# Patient Record
Sex: Male | Born: 1975
Health system: Southern US, Community
[De-identification: ages and names within clinical notes are randomized; demographics above are authoritative.]

## PROBLEM LIST (undated history)

## (undated) DIAGNOSIS — K219 Gastro-esophageal reflux disease without esophagitis: Secondary | ICD-10-CM

## (undated) DIAGNOSIS — E785 Hyperlipidemia, unspecified: Secondary | ICD-10-CM

## (undated) DIAGNOSIS — F419 Anxiety disorder, unspecified: Secondary | ICD-10-CM

## (undated) DIAGNOSIS — T7840XA Allergy, unspecified, initial encounter: Secondary | ICD-10-CM

## (undated) DIAGNOSIS — I10 Essential (primary) hypertension: Secondary | ICD-10-CM

## (undated) DIAGNOSIS — M1990 Unspecified osteoarthritis, unspecified site: Secondary | ICD-10-CM

## (undated) HISTORY — DX: Hyperlipidemia, unspecified: E78.5

## (undated) HISTORY — DX: Unspecified osteoarthritis, unspecified site: M19.90

## (undated) HISTORY — DX: Anxiety disorder, unspecified: F41.9

## (undated) HISTORY — PX: TONSILLECTOMY: SUR1361

## (undated) HISTORY — DX: Gastro-esophageal reflux disease without esophagitis: K21.9

## (undated) HISTORY — DX: Allergy, unspecified, initial encounter: T78.40XA

## (undated) HISTORY — PX: OTHER SURGICAL HISTORY: SHX169

---
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
## 2016-07-30 ENCOUNTER — Emergency Department (HOSPITAL_BASED_OUTPATIENT_CLINIC_OR_DEPARTMENT_OTHER): Payer: BLUE CROSS/BLUE SHIELD

## 2016-07-30 ENCOUNTER — Emergency Department (HOSPITAL_BASED_OUTPATIENT_CLINIC_OR_DEPARTMENT_OTHER)
Admission: EM | Admit: 2016-07-30 | Discharge: 2016-07-30 | Disposition: A | Discharge status: Home / Self Care | Payer: BLUE CROSS/BLUE SHIELD | Attending: Emergency Medicine | Admitting: Emergency Medicine

## 2016-07-30 ENCOUNTER — Encounter (HOSPITAL_BASED_OUTPATIENT_CLINIC_OR_DEPARTMENT_OTHER): Payer: Self-pay | Admitting: *Deleted

## 2016-07-30 DIAGNOSIS — Y998 Other external cause status: Secondary | ICD-10-CM | POA: Diagnosis not present

## 2016-07-30 DIAGNOSIS — Y929 Unspecified place or not applicable: Secondary | ICD-10-CM | POA: Insufficient documentation

## 2016-07-30 DIAGNOSIS — R0789 Other chest pain: Secondary | ICD-10-CM | POA: Insufficient documentation

## 2016-07-30 DIAGNOSIS — Y9372 Activity, wrestling: Secondary | ICD-10-CM | POA: Insufficient documentation

## 2016-07-30 DIAGNOSIS — Z791 Long term (current) use of non-steroidal anti-inflammatories (NSAID): Secondary | ICD-10-CM | POA: Diagnosis not present

## 2016-07-30 DIAGNOSIS — X58XXXA Exposure to other specified factors, initial encounter: Secondary | ICD-10-CM | POA: Diagnosis not present

## 2016-07-30 DIAGNOSIS — R0781 Pleurodynia: Secondary | ICD-10-CM | POA: Diagnosis not present

## 2016-07-30 DIAGNOSIS — S299XXA Unspecified injury of thorax, initial encounter: Secondary | ICD-10-CM | POA: Diagnosis not present

## 2016-07-30 HISTORY — PX: OTHER SURGICAL HISTORY: SHX169

## 2016-07-30 NOTE — ED Triage Notes (Signed)
Pt c/o right rib injury x 4 hrs ago

## 2016-07-30 NOTE — Discharge Instructions (Signed)
We suspect that you might have a broken rib not seen on x-ray. Take Tylenol or Advil for pain. Hold an ice pack on the painful area 4 times daily for 30 minutes at a time for the first 2 or 3 days. After 2 or 3 days, heat may feel better. You can stand under the warm shower and let warm water hit the area .Use the incentive spirometer as directed. See an urgent care center if having significant pain in 7-10 days. Return if concern for any reason

## 2016-07-30 NOTE — ED Provider Notes (Signed)
[facility] Provider Note   CSN: [id number] Arrival date & time: 07/30/16  1520  By signing my name below, I, Bernard Mora, attest that this documentation has been prepared under the direction and in the presence of Bernard Dakin, MD . Electronically Signed: Neta Mora, ED Scribe. 07/30/2016. 3:54 PM.    History   Chief Complaint Chief Complaint  Patient presents with  . Rib Injury    The history is provided by the patient. No language interpreter was used.   HPI Comments:  Bernard Mora is a 40 y.o. male who presents to the Emergency Department s/p right rib injury 4 years ago. Pt states that he was wrestling with his 40 year old on the floor when the injury occurred.His daughter may need him or kicked him in the ribs while wrestling. Pain is right mid rib cage, nonradiating. Moderate at present. Worse with changing position or pressing on the area. Denies abdominal pain Pt notes that he heard a "click" from his right ribs. Pt states that his current pain is alleviated when putting pressure on his ribs with his hands, and the pain is exacerbated when moving, taking deep breaths, and laying down. Pt complains of associated SOB. Pt does not smoke or use illicit dugs, but does drink EtOH occasionally. No other alleviating factors noted. Pt denies abdominal pain. No treatment prior to coming here  History reviewed. No pertinent past medical history.  There are no active problems to display for this patient.   Past Surgical History:  Procedure Laterality Date  . TONSILLECTOMY         Home Medications    Prior to Admission medications   Medication Sig Start Date End Date Taking? Authorizing Provider  ibuprofen (ADVIL,MOTRIN) 600 MG tablet Take 600 mg by mouth every 6 (six) hours as needed.   Yes Historical Provider, MD    Family History History reviewed. No pertinent family history.  Social History Social History  Substance Use Topics  . Smoking  status: Never Smoker  . Smokeless tobacco: Not on file  . Alcohol use No     Allergies   Patient has no known allergies.   Review of Systems Review of Systems  Constitutional: Negative.   HENT: Negative.   Respiratory: Negative.   Cardiovascular: Positive for chest pain.       Right chest pain  Gastrointestinal: Negative.   Musculoskeletal: Negative.   Skin: Negative.   Allergic/Immunologic: Negative.   Neurological: Negative.   Psychiatric/Behavioral: Negative.   All other systems reviewed and are negative.    Physical Exam Updated Vital Signs BP 162/86   Pulse 78   Temp 98.5 F (36.9 C) (Oral)   Resp 22   Ht 6' (1.829 m)   Wt 180 lb (81.6 kg)   SpO2 99%   BMI 24.41 kg/m   Physical Exam  Constitutional: He appears well-developed and well-nourished. No distress.  HENT:  Head: Normocephalic and atraumatic.  Eyes: Conjunctivae are normal.  Cardiovascular: Normal rate.   Pulmonary/Chest: Effort normal. He exhibits tenderness.  Chest without swelling or ecchymosis. Tender at right ribs immediately lateral to midclavicular line. Approximate 5 cm superior to hostile margin. No crepitance or flail  Abdominal: He exhibits no distension.  Neurological: He is alert.  Skin: Skin is warm and dry.  Psychiatric: He has a normal mood and affect.  Nursing note and vitals reviewed.    ED Treatments / Results  DIAGNOSTIC STUDIES:  Oxygen Saturation is 99% on RA,  normal by my interpretation.    COORDINATION OF CARE:  3:54 PM Discussed treatment plan with pt at bedside and pt agreed to plan.   Labs (all labs ordered are listed, but only abnormal results are displayed) Labs Reviewed - No data to display  EKG  EKG Interpretation None       Radiology No results found. X-ray viewed by me No results found for this or any previous visit. Dg Ribs Unilateral W/chest Right  Result Date: 07/30/2016 CLINICAL DATA:  Pain following wrestling injury EXAM: RIGHT RIBS  AND CHEST - 3+ VIEW COMPARISON:  None. FINDINGS: Frontal chest as well as oblique and cone-down lower rib images obtained. Lungs are clear. Heart size and pulmonary vascularity are normal. No adenopathy. No pneumothorax or pleural effusion. No evident rib fracture. IMPRESSION: No evident rib fracture.  Lungs clear. Electronically Signed   By: Lowella Grip III M.D.   On: 07/30/2016 16:15   Procedures Procedures (including critical care time)  Medications Ordered in ED Medications - No data to display   Initial Impression / Assessment and Plan / ED Course  I have reviewed the triage vital signs and the nursing notes.  Pertinent labs & imaging results that were available during my care of the patient were reviewed by me and considered in my medical decision making (see chart for details).  Clinical Course   Declines pain medicine Patient may have rib fracture clinically that is not apparent on x-ray. Plan Tylenol or Advil for pain. Incentive spirometry. Follow-up at urgent care center if having significant discomfort in one to 2 weeks.  Final Clinical Impressions(s) / ED Diagnoses   Chest wall pain Final diagnoses:  None    New Prescriptions New Prescriptions   No medications on file  I personally performed the services described in this documentation, which was scribed in my presence. The recorded information has been reviewed and considered.     Bernard Dakin, MD 07/30/16 [phone]

## 2016-10-18 ENCOUNTER — Encounter: Payer: Self-pay | Admitting: Sports Medicine

## 2016-10-18 ENCOUNTER — Encounter: Payer: Self-pay | Admitting: Physician Assistant

## 2016-10-18 ENCOUNTER — Ambulatory Visit (INDEPENDENT_AMBULATORY_CARE_PROVIDER_SITE_OTHER): Payer: BLUE CROSS/BLUE SHIELD | Admitting: Sports Medicine

## 2016-10-18 ENCOUNTER — Ambulatory Visit (INDEPENDENT_AMBULATORY_CARE_PROVIDER_SITE_OTHER): Payer: BLUE CROSS/BLUE SHIELD | Admitting: Physician Assistant

## 2016-10-18 ENCOUNTER — Ambulatory Visit: Payer: Self-pay

## 2016-10-18 VITALS — BP 138/90 | HR 70 | Ht 71.5 in | Wt 183.0 lb

## 2016-10-18 VITALS — BP 140/88 | HR 70 | Temp 99.0°F | Ht 71.5 in | Wt 183.0 lb

## 2016-10-18 DIAGNOSIS — S2329XS Dislocation of other parts of thorax, sequela: Secondary | ICD-10-CM | POA: Diagnosis not present

## 2016-10-18 DIAGNOSIS — R0781 Pleurodynia: Secondary | ICD-10-CM

## 2016-10-18 DIAGNOSIS — M9908 Segmental and somatic dysfunction of rib cage: Secondary | ICD-10-CM | POA: Diagnosis not present

## 2016-10-18 DIAGNOSIS — J0110 Acute frontal sinusitis, unspecified: Secondary | ICD-10-CM | POA: Diagnosis not present

## 2016-10-18 MED ORDER — BENZONATATE 200 MG PO CAPS: 200.0000 mg | ORAL_CAPSULE | Freq: Two times a day (BID) | ORAL | 0 refills | Status: DC | PRN

## 2016-10-18 MED ORDER — AMOXICILLIN-POT CLAVULANATE 875-125 MG PO TABS: 1.0000 | ORAL_TABLET | Freq: Two times a day (BID) | ORAL | 0 refills | Status: AC

## 2016-10-18 NOTE — Patient Instructions (Signed)
It was great meeting you today!  Follow-up with Bernard Mora for a physical at your earliest convenience.  Start the antibiotic for your sinus infection. I have also sent in Bernard Mora for your cough. Please let Bernard Mora know if you develop any worsening symptoms, especially if you develop fevers or shortness of breath.   Sinusitis, Adult Sinusitis is soreness and inflammation of your sinuses. Sinuses are hollow spaces in the bones around your face. Your sinuses are located:  Around your eyes.  In the middle of your forehead.  Behind your nose.  In your cheekbones. Your sinuses and nasal passages are lined with a stringy fluid (mucus). Mucus normally drains out of your sinuses. When your nasal tissues become inflamed or swollen, the mucus can become trapped or blocked so air cannot flow through your sinuses. This allows bacteria, viruses, and funguses to grow, which leads to infection. Sinusitis can develop quickly and last for 7?10 days (acute) or for more than 12 weeks (chronic). Sinusitis often develops after a cold. What are the causes? This condition is caused by anything that creates swelling in the sinuses or stops mucus from draining, including:  Allergies.  Asthma.  Bacterial or viral infection.  Abnormally shaped bones between the nasal passages.  Nasal growths that contain mucus (nasal polyps).  Narrow sinus openings.  Pollutants, such as chemicals or irritants in the air.  A foreign object stuck in the nose.  A fungal infection. This is rare. What increases the risk? The following factors may make you more likely to develop this condition:  Having allergies or asthma.  Having had a recent cold or respiratory tract infection.  Having structural deformities or blockages in your nose or sinuses.  Having a weak immune system.  Doing a lot of swimming or diving.  Overusing nasal sprays.  Smoking. What are the signs or symptoms? The main symptoms of this condition  are pain and a feeling of pressure around the affected sinuses. Other symptoms include:  Upper toothache.  Earache.  Headache.  Bad breath.  Decreased sense of smell and taste.  A cough that may get worse at night.  Fatigue.  Fever.  Thick drainage from your nose. The drainage is often green and it may contain pus (purulent).  Stuffy nose or congestion.  Postnasal drip. This is when extra mucus collects in the throat or back of the nose.  Swelling and warmth over the affected sinuses.  Sore throat.  Sensitivity to light. How is this diagnosed? This condition is diagnosed based on symptoms, a medical history, and a physical exam. To find out if your condition is acute or chronic, your health care provider may:  Look in your nose for signs of nasal polyps.  Tap over the affected sinus to check for signs of infection.  View the inside of your sinuses using an imaging device that has a light attached (endoscope). If your health care provider suspects that you have chronic sinusitis, you may also:  Be tested for allergies.  Have a sample of mucus taken from your nose (nasal culture) and checked for bacteria.  Have a mucus sample examined to see if your sinusitis is related to an allergy. If your sinusitis does not respond to treatment and it lasts longer than 8 weeks, you may have an MRI or CT scan to check your sinuses. These scans also help to determine how severe your infection is. In rare cases, a bone biopsy may be done to rule out more serious types  of fungal sinus disease. How is this treated? Treatment for sinusitis depends on the cause and whether your condition is chronic or acute. If a virus is causing your sinusitis, your symptoms will go away on their own within 10 days. You may be given medicines to relieve your symptoms, including:  Topical nasal decongestants. They shrink swollen nasal passages and let mucus drain from your sinuses.  Antihistamines. These  drugs block inflammation that is triggered by allergies. This can help to ease swelling in your nose and sinuses.  Topical nasal corticosteroids. These are nasal sprays that ease inflammation and swelling in your nose and sinuses.  Nasal saline washes. These rinses can help to get rid of thick mucus in your nose. If your condition is caused by bacteria, you will be given an antibiotic medicine. If your condition is caused by a fungus, you will be given an antifungal medicine. Surgery may be needed to correct underlying conditions, such as narrow nasal passages. Surgery may also be needed to remove polyps. Follow these instructions at home: Medicines   Take, use, or apply over-the-counter and prescription medicines only as told by your health care provider. These may include nasal sprays.  If you were prescribed an antibiotic medicine, take it as told by your health care provider. Do not stop taking the antibiotic even if you start to feel better. Hydrate and Humidify   Drink enough water to keep your urine clear or pale yellow. Staying hydrated will help to thin your mucus.  Use a cool mist humidifier to keep the humidity level in your home above 50%.  Inhale steam for 10-15 minutes, 3-4 times a day or as told by your health care provider. You can do this in the bathroom while a hot shower is running.  Limit your exposure to cool or dry air. Rest   Rest as much as possible.  Sleep with your head raised (elevated).  Make sure to get enough sleep each night. General instructions   Apply a warm, moist washcloth to your face 3-4 times a day or as told by your health care provider. This will help with discomfort.  Wash your hands often with soap and water to reduce your exposure to viruses and other germs. If soap and water are not available, use hand sanitizer.  Do not smoke. Avoid being around people who are smoking (secondhand smoke).  Keep all follow-up visits as told by your  health care provider. This is important. Contact a health care provider if:  You have a fever.  Your symptoms get worse.  Your symptoms do not improve within 10 days. Get help right away if:  You have a severe headache.  You have persistent vomiting.  You have pain or swelling around your face or eyes.  You have vision problems.  You develop confusion.  Your neck is stiff.  You have trouble breathing. This information is not intended to replace advice given to you by your health care provider. Make sure you discuss any questions you have with your health care provider. Document Released: 08/06/2005 Document Revised: 04/01/2016 Document Reviewed: 06/01/2015 Elsevier Interactive Patient Education  2017 [employer].

## 2016-10-18 NOTE — Progress Notes (Signed)
Bernard Mora - 41 y.o. male MRN [id number]  Date of birth: June 09, 1976  Office Visit Note: Visit Date: 10/18/2016 PCP: Bernard Coke, PA Referred by: Bernard Mora, [place]  Subjective: Chief Complaint  Patient presents with  . stiffness right side of rib cage    injured 12 weeks ago   HPI: Patient presents for evaluation of right anterior rib pain following an injury 12 weeks ago while sparring with his brother during jujitsu.  He reported acute onset of pain and crepitation that became progressively painful and was ultimately seen in the emergency department several hours after the incident occurred.  He difficulty breathing at this time.  X-rays did not reveal any significant bony abnormalities and his symptoms subsequently significantly improved.  Over the past 4 weeks however he has had consistent Bernard Mora is not using to speak.  He continues to have a prominence along the rest of the run out of his cage.  Ibuprofen has not significantly helpful.  This does not really worsen following selectivity since we goes the gym tries to run or perform the elliptical.  ROS:  Otherwise per HPI.  Objective:  VS:  HT:5' 11.5" (181.6 cm)   WT:183 lb (83 kg)  BMI:25.2    BP:138/90  HR:70bpm  TEMP: ( )  RESP:98 % Physical Exam: GENERAL:  WDWN, NAD, Non-toxic appearing PSYCH:  Alert & appropriately interactive  Not depressed or anxious appearing CHEST:  Pectus excavatum with increased prominence on the right compared to the left.  No flail chest. No significant overlying skin changes.  No bruising or ecchymosis. He has good thoracic excursion.  Full overhead range of motion of the shoulders.  Normal cervical range of motion. Structural exam: He has posterior ribs 6 through 9 on the right with costochondral prominence.  Imaging & Procedures: No results found. PROCEDURE NOTE : OSTEOPATHIC MANIPULATION The decision today to treat with Osteopathic Manipulative Therapy (OMT) was based on  physical exam findings. Verbal consent was obtained after after explanation of risks, benefits and potential side effects, including acute pain flare, post manipulation soreness and need for repeat treatments.  If Cervical manipulation was performed additional time was spent discussing the associated minimal risk of  injury to neurovascular structures.  After consent was obtained manipulation was performed as below:            Regions treated:  Per billing codes          Techniques used:  Muscle Energy, MFR, Indirect, CS  and ART The patient tolerated the treatment well and reported Improved symptoms following treatment today. Patient was given medications, exercises, stretches and lifestyle modifications per AVS and verbally.     Assessment & Plan: Problem List Items Addressed This Visit    Costochondral separation    Patient symptoms are consistent with costochondral separation without significant deformity.  He has underlying pectus excavatum which likely contributed given the low force.  Overall he is continuing to show good improvement.  Sample of Pennsaid was provided today for him to trial.  If this is helpful he will call for a prescription.  Otherwise slow return activities as tolerated.       Other Visit Diagnoses    Rib pain on right side    -  Primary   Somatic dysfunction of rib cage region          Follow-up: Return if symptoms worsen or fail to improve.   Past Medical/Family/Surgical/Social History: Medications & Allergies reviewed per EMR Patient Active Problem  List   Diagnosis Date Noted  . Costochondral separation 10/24/2016   Past Medical History:  Diagnosis Date  . Allergy   . Hyperlipidemia    Family History  Problem Relation Age of Onset  . Arthritis Mother   . Diverticulitis Mother   . Cancer Father   . Diabetes Paternal Grandfather    Past Surgical History:  Procedure Laterality Date  . crack ribs Right 07/30/2016  . FRACTURE SURGERY Left 1993    wrist  . TONSILLECTOMY     Social History   Occupational History  . Not on file.   Social History Main Topics  . Smoking status: Never Smoker  . Smokeless tobacco: Never Used  . Alcohol use 2.4 - 3.0 oz/week    4 - 5 Cans of beer per week  . Drug use: No  . Sexual activity: Yes    Partners: Female

## 2016-10-18 NOTE — Progress Notes (Signed)
Subjective:    Patient ID: Bernard Mora, male    DOB: 12-14-1975, 41 y.o.   MRN: [id number]  HPI  Bernard Mora is a 41 y/o male who presents to clinic today to establish care.  Acute Concerns: Upper respiratory infection: cough, nasal congestion with brown drainage, headache, sinus pressure x 2-3 weeks. He recently returned from trip from [place] two weeks ago. Started taking Mucinex with mild relief. No fevers, but having hot and cold spells. Yesterday was worst day. No hx of asthma or PNA. No known sick contacts.  Right rib cage pain: initial injury on 07/2016 from jiu jitsu with his brother, his brother's chest made contact with his R lower anterior rib cage and it made a "clicking sound", he went to the emergency department in 07/30/16 and x-ray showed no rib fracture with clear lungs. He declined pain medications and treated with rest, deep breathing and OTC tylenol/ibuprofen. After about 6 weeks he had some improvement and he returned to working out. He still has some stiffness and feels as though there is a "golf ball" sensation under his lower R anterior rib. He initially had bruising immediately after injury but this has resolved. He denies any chest pain or SOB.  Chronic Issues: Hyperlipidemia -- not looked at Jan 2016, was on a mild statin for 1-2 months, well controlled with diet, needs re-check at physical Hx of elevated blood pressure readings -- diet related, never on medications  Review of Systems  See HPI  Past Medical History:  Diagnosis Date  . Allergy   . Hyperlipidemia      Social History   Social History  . Marital status: Married    Spouse name: N/A  . Number of children: N/A  . Years of education: N/A   Occupational History  . Not on file.   Social History Main Topics  . Smoking status: Never Smoker  . Smokeless tobacco: Never Used  . Alcohol use 2.4 - 3.0 oz/week    4 - 5 Cans of beer per week  . Drug use: No  . Sexual activity: Yes    Partners: Female    Other Topics Concern  . Not on file   Social History Narrative   Live in [place]; originally from [place]   Works in Nurse, learning disability   Married, two children 2.5 and 8   Hobbies: spending time with kids, sports, martial arts, cars    Past Surgical History:  Procedure Laterality Date  . crack ribs Right 07/30/2016  . FRACTURE SURGERY Left 1993   wrist  . TONSILLECTOMY      Family History  Problem Relation Age of Onset  . Arthritis Mother   . Diverticulitis Mother   . Cancer Father   . Diabetes Paternal Grandfather     No Known Allergies  No current outpatient prescriptions on file prior to visit.   No current facility-administered medications on file prior to visit.     BP 140/88 (BP Location: Left Arm, Patient Position: Sitting, Cuff Size: Normal)   Pulse 70   Temp 99 F (37.2 C) (Oral)   Ht 5' 11.5" (1.816 m)   Wt 183 lb (83 kg)   SpO2 98%   BMI 25.17 kg/m      Objective:   Physical Exam  Constitutional: Vital signs are normal. He appears well-developed and well-nourished. He is cooperative.  Non-toxic appearance. He does not have a sickly appearance. He does not appear ill. No distress.  HENT:  Head: Normocephalic and atraumatic.  Right Ear: Tympanic membrane, external ear and ear canal normal. Tympanic membrane is not erythematous, not retracted and not bulging.  Left Ear: Tympanic membrane, external ear and ear canal normal. Tympanic membrane is not erythematous, not retracted and not bulging.  Nose: Right sinus exhibits frontal sinus tenderness. Right sinus exhibits no maxillary sinus tenderness. Left sinus exhibits frontal sinus tenderness. Left sinus exhibits no maxillary sinus tenderness.  Mouth/Throat: Uvula is midline. Posterior oropharyngeal erythema present. No oropharyngeal exudate or posterior oropharyngeal edema.  Cardiovascular: Normal rate, regular rhythm and normal heart sounds.   Pulmonary/Chest: Effort normal and breath sounds normal.  No accessory muscle usage. No respiratory distress.  Lungs clear to auscultation bilaterally  Musculoskeletal:  No bony tenderness to R anterior lower rib cage. Palpable bony deformity when compared to L anterior lower rib cage. No ecchymosis or erythema.  Lymphadenopathy:    He has no cervical adenopathy.  Neurological: He is alert.  Skin: Skin is warm, dry and intact.  Nursing note and vitals reviewed.     Assessment & Plan:  1. Rib pain on right side Discussed case briefly with Dr. Teresa Coombs. Patient would benefit from ultrasound to further evaluate and treat. Patient to see Dr. Teresa Coombs this morning and is in agreement to plan. Appreciate coordination of care.  2. Acute non-recurrent frontal sinusitis Given duration of symptoms and physical exam, will treat with Augmentin per orders. Tessalon for cough -- patient states that he is very sensitive to codeine-containing products. Advised follow-up with [place] if symptoms do not improve. Especially if worsening SOB or development of fevers.  Recommend follow-up for physical exam at earliest convenience.  Inda Coke PA-C 10/18/16

## 2016-10-18 NOTE — Progress Notes (Signed)
Pre visit review using our clinic review tool, if applicable. No additional management support is needed unless otherwise documented below in the visit note. 

## 2016-10-18 NOTE — Patient Instructions (Signed)
Please perform the exercise program that Jeneen Rinks has prepared for you and gone over in detail on a daily basis.  In addition to the handout you were provided you can access your program through: [email or web address]   Your unique program code is: [id number]

## 2016-10-24 DIAGNOSIS — S2329XA Dislocation of other parts of thorax, initial encounter: Secondary | ICD-10-CM | POA: Insufficient documentation

## 2016-10-24 NOTE — Assessment & Plan Note (Signed)
Patient symptoms are consistent with costochondral separation without significant deformity.  He has underlying pectus excavatum which likely contributed given the low force.  Overall he is continuing to show good improvement.  Sample of Pennsaid was provided today for him to trial.  If this is helpful he will call for a prescription.  Otherwise slow return activities as tolerated.

## 2017-05-23 ENCOUNTER — Encounter: Payer: Self-pay | Admitting: Physician Assistant

## 2017-05-23 ENCOUNTER — Ambulatory Visit (INDEPENDENT_AMBULATORY_CARE_PROVIDER_SITE_OTHER): Payer: BLUE CROSS/BLUE SHIELD | Admitting: Physician Assistant

## 2017-05-23 VITALS — BP 130/90 | HR 56 | Temp 98.2°F | Ht 71.5 in | Wt 180.0 lb

## 2017-05-23 DIAGNOSIS — Z125 Encounter for screening for malignant neoplasm of prostate: Secondary | ICD-10-CM

## 2017-05-23 DIAGNOSIS — Z0001 Encounter for general adult medical examination with abnormal findings: Secondary | ICD-10-CM

## 2017-05-23 DIAGNOSIS — F419 Anxiety disorder, unspecified: Secondary | ICD-10-CM | POA: Diagnosis not present

## 2017-05-23 DIAGNOSIS — R61 Generalized hyperhidrosis: Secondary | ICD-10-CM | POA: Diagnosis not present

## 2017-05-23 DIAGNOSIS — E785 Hyperlipidemia, unspecified: Secondary | ICD-10-CM

## 2017-05-23 DIAGNOSIS — Z114 Encounter for screening for human immunodeficiency virus [HIV]: Secondary | ICD-10-CM

## 2017-05-23 MED ORDER — SERTRALINE HCL 25 MG PO TABS: 25.0000 mg | ORAL_TABLET | Freq: Every day | ORAL | 1 refills | Status: DC

## 2017-05-23 NOTE — Patient Instructions (Addendum)
It was great to see you!  Please make an appointment with the lab on your way out. I would like for you to return for lab work within 1-2 weeks. After midnight on the day of the lab draw, please do not eat anything. You may have water, black coffee, unsweetened tea.  Follow-up in 6 weeks to discuss the zoloft.  Aluminum Chloride topical solution What is this medicine? Aluminum Chloride (a LOO mi num klor ide) is used to control excessive sweating. This medicine may be used for other purposes; ask your health care provider or pharmacist if you have questions. COMMON BRAND NAME(S): Drysol, Hypercare, Docia Chuck What should I tell my health care provider before I take this medicine? They need to know if you have any of these conditions: -an unusual or allergic reaction to aluminum chloride, other medicines, foods, dyes, or preservatives -pregnant or trying to get pregnant -breast-feeding How should I use this medicine? This medicine is for external use only. Follow the directions on the prescription label. Make sure the skin is dry before use. Apply to the affected area as directed by your doctor or health care professional, usually at bedtime. Avoid contact with broken, irritated or recently shaved skin. Do not use your medicine more often than directed. Talk to your pediatrician regarding the use of this medicine in children. Special care may be needed. Overdosage: If you think you have taken too much of this medicine contact a poison control center or emergency room at once. NOTE: This medicine is only for you. Do not share this medicine with others. What if I miss a dose? If you miss a dose, use it as soon as you can. If it is almost time for your next dose, use only that dose. Do not use double or extra doses. What may interact with this medicine? Interactions are not expected. Do not use any other skin products on the affected area without asking your doctor or health care  professional. This list may not describe all possible interactions. Give your health care provider a list of all the medicines, herbs, non-prescription drugs, or dietary supplements you use. Also tell them if you smoke, drink alcohol, or use illegal drugs. Some items may interact with your medicine. What should I watch for while using this medicine? You may notice a decrease in sweating after two treatments. Call your doctor or health care professional if your condition does not start to get better or if it gets worse. To help increase the effect of this medicine, your doctor or health care professional may tell you to cover the treated area with saran wrap held in place by a snug fitting t-shirt, mitten or sock. Do not use tape to hold the saran wrap in place. This medicine may discolor fabrics and may be harmful to certain metals. Avoid contact with clothing and jewelry. Do not use this medicine near open flame. What side effects may I notice from receiving this medicine? Side effects that you should report to your doctor or health care professional as soon as possible: -allergic reactions like skin rash, itching or hives, swelling of the face, lips, or tongue -excessive irritation or sensitivity Side effects that usually do not require medical attention (report to your doctor or health care professional if they continue or are bothersome): -mild irritation This list may not describe all possible side effects. Call your doctor for medical advice about side effects. You may report side effects to FDA at 1-800-FDA-1088. Where should I  keep my medicine? Keep out of the reach of children. Store at room temperature between 15 and 30 degrees C (59 and 86 degrees C). Throw away any unused medicine after the expiration date. NOTE: This sheet is a summary. It may not cover all possible information. If you have questions about this medicine, talk to your doctor, pharmacist, or health care provider.  2018  Elsevier/Gold Standard (2013-06-10 17:30:08)    Health Maintenance, Male A healthy lifestyle and preventive care is important for your health and wellness. Ask your health care provider about what schedule of regular examinations is right for you. What should I know about weight and diet? Eat a Healthy Diet  Eat plenty of vegetables, fruits, whole grains, low-fat dairy products, and lean protein.  Do not eat a lot of foods high in solid fats, added sugars, or salt.  Maintain a Healthy Weight Regular exercise can help you achieve or maintain a healthy weight. You should:  Do at least 150 minutes of exercise each week. The exercise should increase your heart rate and make you sweat (moderate-intensity exercise).  Do strength-training exercises at least twice a week.  Watch Your Levels of Cholesterol and Blood Lipids  Have your blood tested for lipids and cholesterol every 5 years starting at 41 years of age. If you are at high risk for heart disease, you should start having your blood tested when you are 41 years old. You may need to have your cholesterol levels checked more often if: ? Your lipid or cholesterol levels are high. ? You are older than 41 years of age. ? You are at high risk for heart disease.  What should I know about cancer screening? Many types of cancers can be detected early and may often be prevented. Lung Cancer  You should be screened every year for lung cancer if: ? You are a current smoker who has smoked for at least 30 years. ? You are a former smoker who has quit within the past 15 years.  Talk to your health care provider about your screening options, when you should start screening, and how often you should be screened.  Colorectal Cancer  Routine colorectal cancer screening usually begins at 41 years of age and should be repeated every 5-10 years until you are 41 years old. You may need to be screened more often if early forms of precancerous polyps  or small growths are found. Your health care provider may recommend screening at an earlier age if you have risk factors for colon cancer.  Your health care provider may recommend using home test kits to check for hidden blood in the stool.  A small camera at the end of a tube can be used to examine your colon (sigmoidoscopy or colonoscopy). This checks for the earliest forms of colorectal cancer.  Prostate and Testicular Cancer  Depending on your age and overall health, your health care provider may do certain tests to screen for prostate and testicular cancer.  Talk to your health care provider about any symptoms or concerns you have about testicular or prostate cancer.  Skin Cancer  Check your skin from head to toe regularly.  Tell your health care provider about any new moles or changes in moles, especially if: ? There is a change in a mole's size, shape, or color. ? You have a mole that is larger than a pencil eraser.  Always use sunscreen. Apply sunscreen liberally and repeat throughout the day.  Protect yourself by wearing long  sleeves, pants, a wide-brimmed hat, and sunglasses when outside.  What should I know about heart disease, diabetes, and high blood pressure?  If you are 45-44 years of age, have your blood pressure checked every 3-5 years. If you are 75 years of age or older, have your blood pressure checked every year. You should have your blood pressure measured twice-once when you are at a hospital or clinic, and once when you are not at a hospital or clinic. Record the average of the two measurements. To check your blood pressure when you are not at a hospital or clinic, you can use: ? An automated blood pressure machine at a pharmacy. ? A home blood pressure monitor.  Talk to your health care provider about your target blood pressure.  If you are between 51-3 years old, ask your health care provider if you should take aspirin to prevent heart disease.  Have  regular diabetes screenings by checking your fasting blood sugar level. ? If you are at a normal weight and have a low risk for diabetes, have this test once every three years after the age of 18. ? If you are overweight and have a high risk for diabetes, consider being tested at a younger age or more often.  A one-time screening for abdominal aortic aneurysm (AAA) by ultrasound is recommended for men aged 36-75 years who are current or former smokers. What should I know about preventing infection? Hepatitis B If you have a higher risk for hepatitis B, you should be screened for this virus. Talk with your health care provider to find out if you are at risk for hepatitis B infection. Hepatitis C Blood testing is recommended for:  Everyone born from [birth year] through 1965.  Anyone with known risk factors for hepatitis C.  Sexually Transmitted Diseases (STDs)  You should be screened each year for STDs including gonorrhea and chlamydia if: ? You are sexually active and are younger than 41 years of age. ? You are older than 41 years of age and your health care provider tells you that you are at risk for this type of infection. ? Your sexual activity has changed since you were last screened and you are at an increased risk for chlamydia or gonorrhea. Ask your health care provider if you are at risk.  Talk with your health care provider about whether you are at high risk of being infected with HIV. Your health care provider may recommend a prescription medicine to help prevent HIV infection.  What else can I do?  Schedule regular health, dental, and eye exams.  Stay current with your vaccines (immunizations).  Do not use any tobacco products, such as cigarettes, chewing tobacco, and e-cigarettes. If you need help quitting, ask your health care provider.  Limit alcohol intake to no more than 2 drinks per day. One drink equals 12 ounces of beer, 5 ounces of wine, or 1 ounces of hard liquor.  Do  not use street drugs.  Do not share needles.  Ask your health care provider for help if you need support or information about quitting drugs.  Tell your health care provider if you often feel depressed.  Tell your health care provider if you have ever been abused or do not feel safe at home. This information is not intended to replace advice given to you by your health care provider. Make sure you discuss any questions you have with your health care provider. Document Released: 02/02/2008 Document Revised: 04/04/2016 Document Reviewed: 05/10/2015  Chartered certified accountant Patient Education  [employer].

## 2017-05-23 NOTE — Progress Notes (Signed)
SCRIBE STATEMENT  Subjective:    Bernard Mora is a 41 y.o. male and is here for a comprehensive physical exam.  HPI  Health Maintenance Due  Topic Date Due  . HIV Screening  03/27/1991    Acute Concerns: Sweating -- over the past 6 months he has noticed increase in sweating -- which also coincides with increase in recent anxiety, mostly in underarms and back of neck, has to change shirt a few times a day because it's so severe; does drink at least 3 cups of coffee daily, denies illicit drugs, changes in weight, denies chest pain, unintentional weight loss, bloody stools Anxiety -- family therapist, sees monthly, oldest child with health issues, changed his job 2 years ago so he could try to balance home life a bit better, considers himself "high-strung"; didn't have a great childhood personally and struggles with his parenting, agreeable to medication  Chronic Issues: Hyperlipidemia -- was put on medications for a brief amount of time for about 2 months a few years ago but they he cleaned his diet up, will re-check today  Health Maintenance: Immunizations -- declined flu shot Colonoscopy -- start at age 44  PSA -- would like screening today -- we had extensive conversation regarding risks/benefits Diet -- eats all food groups but shellfish, avoids cows milk (does do almond milk) Bfast: oatmeal with fruit or scrambled eggs/bacon occasionally, smoothies  Lunch/dinner: lot of vegetables, seafood, lean casseroles, sweet potatoes, bowl of nuts Out: chickfila or panera bread Drink: 2-3 cups coffee in the AM, drinks a lot of water, 2-3 beers a couple times a week, occasional orange juice Caffeine intake -- 2-3 cups coffee in the AM, occasional in the PM Sleep habits -- off and on, wakes up in the middle of the night; very light sleeper, no snoring Exercise -- walk 2-3 miles q other day, kickboxing 3 x week (1.5 hours each time), occasional running Weight -- Weight: 180 lb (81.6 kg)  -- 175 -  182 lb is normal for him, 32 in the waist Mood -- average, lots of stress with family health; job is not stressful   Depression screen PHQ 2/9 05/23/2017  Decreased Interest 0  Down, Depressed, Hopeless 1  PHQ - 2 Score 1   Other providers/specialists: Teresa Coombs -- sports medicine, sees prn   PMHx, SurgHx, SocialHx, Medications, and Allergies were reviewed in the Visit Navigator and updated as appropriate.   Past Medical History:  Diagnosis Date  . Allergy   . Hyperlipidemia      Past Surgical History:  Procedure Laterality Date  . crack ribs Right 07/30/2016  . FRACTURE SURGERY Left 1993   wrist  . TONSILLECTOMY       Family History  Problem Relation Age of Onset  . Arthritis Mother   . Diverticulitis Mother   . Cancer Father        don't know the type, possible kidney/lung and mets  . Diabetes Paternal Grandfather     Social History  Substance Use Topics  . Smoking status: Never Smoker  . Smokeless tobacco: Never Used  . Alcohol use 2.4 - 3.0 oz/week    4 - 5 Cans of beer per week    Review of Systems:   Review of Systems  Constitutional: Positive for diaphoresis. Negative for chills, fever, malaise/fatigue and weight loss.  HENT: Negative for hearing loss, sinus pain and sore throat.   Eyes: Negative for blurred vision.  Respiratory: Negative for cough and shortness of breath.  Cardiovascular: Negative for chest pain, palpitations and leg swelling.  Gastrointestinal: Negative for abdominal pain, constipation, diarrhea, heartburn, nausea and vomiting.  Genitourinary: Negative for dysuria, frequency and urgency.  Musculoskeletal: Negative for back pain, myalgias and neck pain.  Skin: Negative for itching and rash.  Neurological: Negative for dizziness, tingling, seizures, loss of consciousness and headaches.  Endo/Heme/Allergies: Negative for polydipsia.  Psychiatric/Behavioral: Negative for depression. The patient is nervous/anxious.      Objective:   Vitals:   05/23/17 0937 05/23/17 1024  BP: 138/90 130/90  Pulse: (!) 56   Temp: 98.2 F (36.8 C)   SpO2: 99%    Body mass index is 24.76 kg/m.  General Appearance:  Alert, cooperative, no distress, appears stated age  Head:  Normocephalic, without obvious abnormality, atraumatic  Eyes:  PERRL, conjunctiva/corneas clear, EOM's intact, fundi benign, both eyes       Ears:  Normal TM's and external ear canals, both ears  Nose: Nares normal, septum midline, mucosa normal, no drainage    or sinus tenderness  Throat: Lips, mucosa, and tongue normal; teeth and gums normal  Neck: Supple, symmetrical, trachea midline, no adenopathy; thyroid:  No enlargement/tenderness/nodules; no carotit bruit or JVD  Back:   Symmetric, no curvature, ROM normal, no CVA tenderness  Lungs:   Clear to auscultation bilaterally, respirations unlabored  Chest wall:  No tenderness or deformity  Heart:  Regular rate and rhythm, S1 and S2 normal, no murmur, rub   or gallop  Abdomen:   Soft, non-tender, bowel sounds active all four quadrants, no masses, no organomegaly  Extremities: Extremities normal, atraumatic, no cyanosis or edema  Prostate: Not done.   Skin: Skin color, texture, turgor normal, no rashes or lesions  Lymph nodes: Cervical, supraclavicular, and axillary nodes normal  Neurologic: CNII-XII grossly intact. Normal strength, sensation and reflexes throughout    Assessment/Plan:   Bernard Mora was seen today for annual exam.  Diagnoses and all orders for this visit:  Encounter for general adult medical examination with abnormal findings Today patient counseled on age appropriate routine health concerns for screening and prevention, each reviewed and up to date or declined. Immunizations reviewed and up to date or declined. Labs ordered and reviewed. Risk factors for depression reviewed and negative. Hearing function and visual acuity are intact. ADLs screened and addressed as needed.  Functional ability and level of safety reviewed and appropriate. Education, counseling and referrals performed based on assessed risks today. Patient provided with a copy of personalized plan for preventive services. -     Comprehensive metabolic panel; Future -     CBC with Differential/Platelet; Future  Sweating increase Suspect anxiety related, however we would like to check routine labs as well as TSH to assess for organic cause. We briefly discussed Drysol, however he would like to consider this after labs return. Work on hydration. Avoid excessive caffeine intake. -     Comprehensive metabolic panel; Future -     CBC with Differential/Platelet; Future -     TSH; Future  Anxiety Will obtain labs today. Declined therapy, but he is agreeable to starting low-dose SSRI to help with symptoms. Will trial 25 mg Zoloft and have him follow-up in 4-6 weeks to assess efficacy, sooner if needed. -     Comprehensive metabolic panel; Future -     CBC with Differential/Platelet; Future -     TSH; Future -     T4, free; Future  Prostate cancer screening Discussed risks/benefits of checking PSA today. He would  like this level checked. Declined DRE. -     PSA; Future  Hyperlipidemia, unspecified hyperlipidemia type -     Lipid panel; Future  Encounter for screening for HIV -     HIV antibody; Future  Other orders -     sertraline (ZOLOFT) 25 MG tablet; Take 1 tablet (25 mg total) by mouth daily.    Well Adult Exam: Labs ordered: Yes. Patient counseling was done. See below for items discussed. Discussed the patient's BMI.  The BMI BMI is in the acceptable range Follow up in 6 weeks.  Patient Counseling: [x]   Nutrition: Stressed importance of moderation in sodium/caffeine intake, saturated fat and cholesterol, caloric balance, sufficient intake of fresh fruits, vegetables, and fiber.  [x]   Stressed the importance of regular exercise.   []   Substance Abuse: Discussed cessation/primary prevention  of tobacco, alcohol, or other drug use; driving or other dangerous activities under the influence; availability of treatment for abuse.   [x]   Injury prevention: Discussed safety belts, safety helmets, smoke detector, smoking near bedding or upholstery.   []   Sexuality: Discussed sexually transmitted diseases, partner selection, use of condoms, avoidance of unintended pregnancy  and contraceptive alternatives.   [x]   Dental health: Discussed importance of regular tooth brushing, flossing, and dental visits.  [x]   Health maintenance and immunizations reviewed. Please refer to Health maintenance section.     CMA or LPN served as scribe during this visit. History, Physical, and Plan performed by medical provider. Documentation and orders reviewed and attested to.  Inda Coke, PA-C [place]

## 2017-05-24 ENCOUNTER — Other Ambulatory Visit: Payer: BLUE CROSS/BLUE SHIELD

## 2017-05-27 ENCOUNTER — Other Ambulatory Visit (INDEPENDENT_AMBULATORY_CARE_PROVIDER_SITE_OTHER): Payer: BLUE CROSS/BLUE SHIELD

## 2017-05-27 DIAGNOSIS — Z114 Encounter for screening for human immunodeficiency virus [HIV]: Secondary | ICD-10-CM | POA: Diagnosis not present

## 2017-05-27 DIAGNOSIS — Z0001 Encounter for general adult medical examination with abnormal findings: Secondary | ICD-10-CM | POA: Diagnosis not present

## 2017-05-27 DIAGNOSIS — R61 Generalized hyperhidrosis: Secondary | ICD-10-CM | POA: Diagnosis not present

## 2017-05-27 DIAGNOSIS — F419 Anxiety disorder, unspecified: Secondary | ICD-10-CM | POA: Diagnosis not present

## 2017-05-27 DIAGNOSIS — E785 Hyperlipidemia, unspecified: Secondary | ICD-10-CM

## 2017-05-27 DIAGNOSIS — Z125 Encounter for screening for malignant neoplasm of prostate: Secondary | ICD-10-CM

## 2017-05-27 LAB — COMPREHENSIVE METABOLIC PANEL
ALBUMIN: 4.4 g/dL (ref 3.5–5.2)
ALK PHOS: 43 U/L (ref 39–117)
ALT: 13 U/L (ref 0–53)
AST: 15 U/L (ref 0–37)
BILIRUBIN TOTAL: 0.6 mg/dL (ref 0.2–1.2)
BUN: 11 mg/dL (ref 6–23)
CO2: 30 mEq/L (ref 19–32)
Calcium: 9.3 mg/dL (ref 8.4–10.5)
Chloride: 99 mEq/L (ref 96–112)
Creatinine, Ser: 1.05 mg/dL (ref 0.40–1.50)
GFR: 82.66 mL/min (ref >60.00)
Glucose, Bld: 97 mg/dL (ref 70–99)
POTASSIUM: 3.9 meq/L (ref 3.5–5.1)
SODIUM: 138 meq/L (ref 135–145)
TOTAL PROTEIN: 6.8 g/dL (ref 6.0–8.3)

## 2017-05-27 LAB — CBC WITH DIFFERENTIAL/PLATELET
BASOS ABS: 0 10*3/uL (ref 0.0–0.1)
Basophils Relative: 0.6 % (ref 0.0–3.0)
EOS PCT: 1.4 % (ref 0.0–5.0)
Eosinophils Absolute: 0.1 10*3/uL (ref 0.0–0.7)
HCT: 44.2 % (ref 39.0–52.0)
HEMOGLOBIN: 14.8 g/dL (ref 13.0–17.0)
LYMPHS ABS: 2 10*3/uL (ref 0.7–4.0)
Lymphocytes Relative: 40.5 % (ref 12.0–46.0)
MCHC: 33.5 g/dL (ref 30.0–36.0)
MCV: 83.9 fl (ref 78.0–100.0)
MONO ABS: 0.4 10*3/uL (ref 0.1–1.0)
Monocytes Relative: 7.9 % (ref 3.0–12.0)
NEUTROS PCT: 49.6 % (ref 43.0–77.0)
Neutro Abs: 2.4 10*3/uL (ref 1.4–7.7)
Platelets: 241 10*3/uL (ref 150.0–400.0)
RBC: 5.27 Mil/uL (ref 4.22–5.81)
RDW: 13.3 % (ref 11.5–15.5)
WBC: 4.9 10*3/uL (ref 4.0–10.5)

## 2017-05-27 LAB — LIPID PANEL
CHOL/HDL RATIO: 3
CHOLESTEROL: 209 mg/dL — AB (ref 0–200)
HDL: 66.3 mg/dL (ref >39.00)
LDL Cholesterol: 123 mg/dL — ABNORMAL HIGH (ref 0–99)
NonHDL: 142.32
TRIGLYCERIDES: 97 mg/dL (ref 0.0–149.0)
VLDL: 19.4 mg/dL (ref 0.0–40.0)

## 2017-05-27 LAB — TSH: TSH: 1.61 u[IU]/mL (ref 0.35–4.50)

## 2017-05-27 LAB — T4, FREE: FREE T4: 0.94 ng/dL (ref 0.60–1.60)

## 2017-05-27 LAB — PSA: PSA: 0.52 ng/mL (ref 0.10–4.00)

## 2017-05-28 ENCOUNTER — Telehealth: Payer: Self-pay | Admitting: Physician Assistant

## 2017-05-28 LAB — HIV ANTIBODY (ROUTINE TESTING W REFLEX): HIV: NONREACTIVE

## 2017-05-28 NOTE — Telephone Encounter (Signed)
Patient calling to inquire about the cancer markers and if they were tested as well. Patient is concerned if there was a full work up done. Okay to leave a detailed message on [phone].

## 2017-05-28 NOTE — Telephone Encounter (Signed)
Spoke to pt, told him Bernard Mora said to inform you that his prostate specific antigen (PSA) was checked. I apologize for not specifically mentioning this in his [place]. Told pt PSA level was checked and was in a normal range. There is no further work-up needed regarding this. Pt verbalized understanding.

## 2017-05-28 NOTE — Telephone Encounter (Signed)
Please call patient and inform him that his prostate specific antigen (PSA) was checked. I apologize for not specifically mentioning this in his [place].  Please tell patient that the PSA level was checked and was in a normal range. There is no further work-up needed regarding this.  If there are any further questions, please let me know.  Inda Coke PA-C

## 2017-05-28 NOTE — Telephone Encounter (Signed)
Please see message and advise 

## 2017-07-03 ENCOUNTER — Ambulatory Visit: Payer: BLUE CROSS/BLUE SHIELD | Admitting: Physician Assistant

## 2017-07-03 ENCOUNTER — Encounter: Payer: Self-pay | Admitting: Physician Assistant

## 2017-07-03 VITALS — BP 130/86 | HR 78 | Temp 98.0°F | Ht 71.5 in | Wt 185.0 lb

## 2017-07-03 DIAGNOSIS — F419 Anxiety disorder, unspecified: Secondary | ICD-10-CM | POA: Insufficient documentation

## 2017-07-03 MED ORDER — SERTRALINE HCL 25 MG PO TABS: 25.0000 mg | ORAL_TABLET | Freq: Every day | ORAL | 1 refills | Status: DC

## 2017-07-03 NOTE — Assessment & Plan Note (Signed)
GAD-7 score is 1 today. Patient endorses overall improvement of mood with Zoloft 25 mg. Briefly discussed increasing to 50 mg to see if there is any additional therapeutic benefit, however he is going to hold off on that for now. He may try this over the holidays and if he finds that he is doing better on 50 mg Zoloft, he will call our office and let us know so we can send in Zoloft 50 mg tablets. Follow-up in 6 months, sooner if symptoms worsen or persist.

## 2017-07-03 NOTE — Progress Notes (Signed)
Bernard Mora is a 41 y.o. male is here to discuss: Anxiety  I acted as a Education administrator for [employer], PA-C Anselmo Pickler, LPN  History of Present Illness:   Chief Complaint  Patient presents with  . Follow-up  . Anxiety    Anxiety  Presents for follow-up (Pt feels like he has improved alot since on Zoloft.) visit. Symptoms include insomnia. Patient reports no chest pain, decreased concentration, depressed mood, dizziness, dry mouth, excessive worry, irritability, nausea, nervous/anxious behavior, palpitations, panic, restlessness, shortness of breath or suicidal ideas. Symptoms occur occasionally. The most recent episode lasted 30 minutes. The severity of symptoms is mild. Hours of sleep per night: averages 7 to 8. The quality of sleep is fair. Nighttime awakenings: several.   Compliance with medications is 76-100%.   GAD 7 : Generalized Anxiety Score 07/03/2017  Nervous, Anxious, on Edge 0  Control/stop worrying 0  Worry too much - different things 0  Trouble relaxing 1  Restless 0  Easily annoyed or irritable 0  Afraid - awful might happen 0  Total GAD 7 Score 1  Anxiety Difficulty Not difficult at all       There are no preventive care reminders to display for this patient.  Past Medical History:  Diagnosis Date  . Allergy   . Hyperlipidemia      Social History   Socioeconomic History  . Marital status: Married    Spouse name: Not on file  . Number of children: Not on file  . Years of education: Not on file  . Highest education level: Not on file  Social Needs  . Financial resource strain: Not on file  . Food insecurity - worry: Not on file  . Food insecurity - inability: Not on file  . Transportation needs - medical: Not on file  . Transportation needs - non-medical: Not on file  Occupational History  . Not on file  Tobacco Use  . Smoking status: Never Smoker  . Smokeless tobacco: Never Used  Substance and Sexual Activity  . Alcohol use: Yes   Alcohol/week: 2.4 - 3.0 oz    Types: 4 - 5 Cans of beer per week  . Drug use: No  . Sexual activity: Yes    Partners: Female  Other Topics Concern  . Not on file  Social History Narrative   Live in [place]; originally from [place]   Works in Nurse, learning disability   Married, two children 2.5 and 8   Hobbies: spending time with kids, sports, martial arts, cars    Past Surgical History:  Procedure Laterality Date  . crack ribs Right 07/30/2016  . FRACTURE SURGERY Left 1993   wrist  . TONSILLECTOMY      Family History  Problem Relation Age of Onset  . Arthritis Mother   . Diverticulitis Mother   . Cancer Father        don't know the type, possible kidney/lung and mets  . Diabetes Paternal Grandfather     PMHx, SurgHx, SocialHx, FamHx, Medications, and Allergies were reviewed in the Visit Navigator and updated as appropriate.   Patient Active Problem List   Diagnosis Date Noted  . Costochondral separation 10/24/2016    Social History   Tobacco Use  . Smoking status: Never Smoker  . Smokeless tobacco: Never Used  Substance Use Topics  . Alcohol use: Yes    Alcohol/week: 2.4 - 3.0 oz    Types: 4 - 5 Cans of beer per week  .  Drug use: No    Current Medications and Allergies:    Current Outpatient Medications:  .  acetaminophen (TYLENOL) 500 MG tablet, Take 1,000 mg by mouth every 6 (six) hours as needed., Disp: , Rfl:  .  Multiple Vitamin (MULTIVITAMIN) tablet, Take 1 tablet by mouth daily., Disp: , Rfl:  .  sertraline (ZOLOFT) 25 MG tablet, Take 1 tablet (25 mg total) daily by mouth., Disp: 90 tablet, Rfl: 1  No Known Allergies  Review of Systems   Review of Systems  Constitutional: Negative for irritability.  Respiratory: Negative for shortness of breath.   Cardiovascular: Negative for chest pain and palpitations.  Gastrointestinal: Negative for nausea.  Neurological: Negative for dizziness.  Psychiatric/Behavioral: Negative for decreased concentration and  suicidal ideas. The patient has insomnia. The patient is not nervous/anxious.     Vitals:   Vitals:   07/03/17 1306  BP: 130/86  Pulse: 78  Temp: 98 F (36.7 C)  TempSrc: Oral  SpO2: 95%  Weight: 185 lb (83.9 kg)  Height: 5' 11.5" (1.816 m)     Body mass index is 25.44 kg/m.   Physical Exam:    Physical Exam  Constitutional: He appears well-developed. He is cooperative.  Non-toxic appearance. He does not have a sickly appearance. He does not appear ill. No distress.  Cardiovascular: Normal rate, regular rhythm, S1 normal, S2 normal, normal heart sounds and normal pulses.  No LE edema  Pulmonary/Chest: Effort normal and breath sounds normal.  Neurological: He is alert. GCS eye subscore is 4. GCS verbal subscore is 5. GCS motor subscore is 6.  Skin: Skin is warm, dry and intact.  Psychiatric: He has a normal mood and affect. His speech is normal and behavior is normal.  Pleasant  Nursing note and vitals reviewed.    Assessment and Plan:    Problem List Items Addressed This Visit      Other   Anxiety - Primary    GAD-7 score is 1 today. Patient endorses overall improvement of mood with Zoloft 25 mg. Briefly discussed increasing to 50 mg to see if there is any additional therapeutic benefit, however he is going to hold off on that for now. He may try this over the holidays and if he finds that he is doing better on 50 mg Zoloft, he will call our office and let us know so we can send in Zoloft 50 mg tablets. Follow-up in 6 months, sooner if symptoms worsen or persist.      Relevant Medications   sertraline (ZOLOFT) 25 MG tablet       . Reviewed expectations re: course of current medical issues. . Discussed self-management of symptoms. . Outlined signs and symptoms indicating need for more acute intervention. . Patient verbalized understanding and all questions were answered. . See orders for this visit as documented in the electronic medical record. . Patient  received an After Visit Summary.  CMA or LPN served as scribe during this visit. History, Physical, and Plan performed by medical provider. Documentation and orders reviewed and attested to.  Inda Coke, PA-C [place] 07/03/2017  Follow-up: No Follow-up on file.

## 2017-07-05 ENCOUNTER — Ambulatory Visit: Payer: BLUE CROSS/BLUE SHIELD | Admitting: Physician Assistant

## 2017-08-26 ENCOUNTER — Ambulatory Visit: Payer: BLUE CROSS/BLUE SHIELD | Admitting: Family Medicine

## 2017-08-26 ENCOUNTER — Encounter: Payer: Self-pay | Admitting: Family Medicine

## 2017-08-26 VITALS — BP 128/84 | HR 70 | Temp 98.2°F | Ht 71.5 in | Wt 188.6 lb

## 2017-08-26 DIAGNOSIS — R61 Generalized hyperhidrosis: Secondary | ICD-10-CM | POA: Diagnosis not present

## 2017-08-26 DIAGNOSIS — Z20828 Contact with and (suspected) exposure to other viral communicable diseases: Secondary | ICD-10-CM

## 2017-08-26 DIAGNOSIS — R6883 Chills (without fever): Secondary | ICD-10-CM | POA: Diagnosis not present

## 2017-08-26 LAB — POCT RAPID STREP A (OFFICE): RAPID STREP A SCREEN: NEGATIVE

## 2017-08-26 MED ORDER — OSELTAMIVIR PHOSPHATE 75 MG PO CAPS: 75.0000 mg | ORAL_CAPSULE | Freq: Every day | ORAL | 0 refills | Status: DC

## 2017-08-26 MED ORDER — ALUMINUM CHLORIDE 20 % EX SOLN: Freq: Every day | CUTANEOUS | 0 refills | Status: DC

## 2017-08-26 NOTE — Assessment & Plan Note (Signed)
Recent blood work including TSH, CBC, and CMET all within normal limits.  Unclear etiology for his hyperhidrosis.  Will start aluminum chloride antiperspirant.  Continues to have significant problems, will need referral to dermatology.

## 2017-08-26 NOTE — Progress Notes (Signed)
    Subjective:  Bernard Mora is a 42 y.o. male who presents today with a chief complaint of Chills.   HPI:  Chills, Acute Issue Started about 3 days ago. Improved over the past few hours. Associated with some nausea and headache. Has had several family members diagnosed with both flu and strep over the last few days. Has tried ibuprofen which has helped. No cough. No sore throat. No obvious aggravating factors.   Hyperhidrosis, new issue Started about 2 months ago. Stable over that time. Noticed that it started after starting his zoloft. He has noticed any other obvious precipitating events. Tried antiperspirants which has not helped. No other obvious alleviating or aggravating factors.   ROS: Per HPI  PMH: he reports that  has never smoked. he has never used smokeless tobacco. He reports that he drinks about 2.4 - 3.0 oz of alcohol per week. He reports that he does not use drugs.  Objective:  Physical Exam: BP 128/84 (BP Location: Left Arm, Patient Position: Sitting, Cuff Size: Normal)   Pulse 70   Temp 98.2 F (36.8 C) (Oral)   Ht 5' 11.5" (1.816 m)   Wt 188 lb 9.6 oz (85.5 kg)   SpO2 98%   BMI 25.94 kg/m   Gen: NAD, resting comfortably HEENT: TMs clear.  Oropharynx clear.  No lymphadenopathy. CV: RRR with no murmurs appreciated Pulm: NWOB, CTAB with no crackles, wheezes, or rhonchi  Rapid flu: Negative  Assessment/Plan:  Chills/Exposure to Flu Rapid flu negative. Start prophylactic tamiflu per patient request.  Discussed possible risks and side effects of this medication.  Encouraged good oral hydration.  Also encouraged ibuprofen and/or Tylenol as needed for pain and low-grade fever.  Return precautions reviewed.  Hyperhidrosis Recent blood work including TSH, CBC, and CMET all within normal limits.  Unclear etiology for his hyperhidrosis.  Will start aluminum chloride antiperspirant.  Continues to have significant problems, will need referral to dermatology.    Algis Greenhouse. Jerline Pain, MD 08/26/2017 5:23 PM

## 2017-08-26 NOTE — Patient Instructions (Signed)
Start the Tamiflu.  Use the prescription strength antiperspirant.  You can take 600 mg of ibuprofen or 1000 mg of acetaminophen every 8 hours as needed.  Please stay well-hydrated.  Let me know if your symptoms worsen or are not improving.  Take care, Dr. Jerline Pain

## 2017-09-21 ENCOUNTER — Other Ambulatory Visit: Payer: Self-pay | Admitting: Family Medicine

## 2017-09-21 NOTE — Telephone Encounter (Signed)
Please advise if okay to refill Drysol solution?

## 2017-10-09 ENCOUNTER — Other Ambulatory Visit: Payer: Self-pay | Admitting: *Deleted

## 2017-10-09 MED ORDER — SERTRALINE HCL 25 MG PO TABS: 25.0000 mg | ORAL_TABLET | Freq: Every day | ORAL | 0 refills | Status: DC

## 2017-11-20 ENCOUNTER — Telehealth: Payer: Self-pay | Admitting: Physician Assistant

## 2017-11-20 NOTE — Telephone Encounter (Signed)
Copied from [place]. Topic: Quick Communication - See Telephone Encounter >> Nov 20, 2017  4:35 PM Ivar Drape wrote: CRM for notification. See Telephone encounter for: 11/20/17. Patient has been trying to get his DRYSOL 20 % external solution medication refilled but it has been discontinued.  He would like to know what to use.  Please advise.

## 2017-11-20 NOTE — Telephone Encounter (Signed)
Please see message and advise 

## 2017-11-20 NOTE — Telephone Encounter (Signed)
See note

## 2017-11-20 NOTE — Telephone Encounter (Signed)
Patient asking for a medication to replace Drysol 20 % external solution, the medication has been discontinued.

## 2017-11-21 ENCOUNTER — Other Ambulatory Visit: Payer: Self-pay | Admitting: Physician Assistant

## 2017-11-21 MED ORDER — ALUMINUM CHLORIDE IN ALCOHOL 15 % EX SOLN: 1.0000 | Freq: Every day | CUTANEOUS | 1 refills | Status: DC

## 2017-11-21 NOTE — Telephone Encounter (Signed)
I have sent in an alternative, Hypercare, to his pharmacy.  Inda Coke PA-C

## 2017-12-27 ENCOUNTER — Ambulatory Visit: Payer: BLUE CROSS/BLUE SHIELD | Admitting: Physician Assistant

## 2018-01-07 ENCOUNTER — Other Ambulatory Visit: Payer: Self-pay | Admitting: Physician Assistant

## 2018-01-16 ENCOUNTER — Encounter: Payer: Self-pay | Admitting: Physician Assistant

## 2018-01-16 ENCOUNTER — Ambulatory Visit: Payer: BLUE CROSS/BLUE SHIELD | Admitting: Physician Assistant

## 2018-01-16 VITALS — BP 138/90 | HR 69 | Temp 97.9°F | Ht 71.5 in | Wt 189.0 lb

## 2018-01-16 DIAGNOSIS — R140 Abdominal distension (gaseous): Secondary | ICD-10-CM | POA: Diagnosis not present

## 2018-01-16 DIAGNOSIS — F419 Anxiety disorder, unspecified: Secondary | ICD-10-CM

## 2018-01-16 MED ORDER — SERTRALINE HCL 25 MG PO TABS: 25.0000 mg | ORAL_TABLET | Freq: Every day | ORAL | 1 refills | Status: DC

## 2018-01-16 NOTE — Patient Instructions (Addendum)
It was great to see you!  Let's follow up for a physical in about 6 months (after Oct 4th -- this was your last physical)  Consider a daily probiotic, such as [employer], Digestive Advantage, or Culturelle

## 2018-01-16 NOTE — Progress Notes (Signed)
Bernard Mora is a 42 y.o. male is here to discuss: Aniety  I acted as a Education administrator for [employer], PA-C Anselmo Pickler, LPN  History of Present Illness:   Chief Complaint  Patient presents with  . anxiety follow up    Anxiety  Presents for follow-up visit. Symptoms include malaise (in the afternoon past 3 months). Patient reports no chest pain, confusion, decreased concentration, depressed mood, dizziness, dry mouth, excessive worry, insomnia, irritability, muscle tension, nausea, nervous/anxious behavior, palpitations, panic, restlessness, shortness of breath or suicidal ideas. Symptoms occur occasionally. The most recent episode lasted 5 minutes. The severity of symptoms is mild. The quality of sleep is good. Nighttime awakenings: occasional.   Compliance with medications is 76-100% (Pt is doing very well on medication and is happy with results).   GAD 7 : Generalized Anxiety Score 01/16/2018 07/03/2017  Nervous, Anxious, on Edge 0 0  Control/stop worrying 0 0  Worry too much - different things 0 0  Trouble relaxing 0 1  Restless 0 0  Easily annoyed or irritable 0 0  Afraid - awful might happen 0 0  Total GAD 7 Score 0 1  Anxiety Difficulty Not difficult at all Not difficult at all      Abdominal bloating Patient reports that he has noticed over the past few months that he has had some issues with abdominal bloating.  He has tried to cut out lactose and has for the past 4 years as he knows that this is a significant cause of this.  But now he is wondering if there is other things that are causing his bloating, with questioning at one point if it was a Zoloft.  He consistently gets bad gas pains around 10 AM every morning.  No changes in stools.  Has not really tried any medication for this.   There are no preventive care reminders to display for this patient.  Past Medical History:  Diagnosis Date  . Allergy   . Hyperlipidemia      Social History   Socioeconomic  History  . Marital status: Married    Spouse name: Not on file  . Number of children: Not on file  . Years of education: Not on file  . Highest education level: Not on file  Occupational History  . Not on file  Social Needs  . Financial resource strain: Not on file  . Food insecurity:    Worry: Not on file    Inability: Not on file  . Transportation needs:    Medical: Not on file    Non-medical: Not on file  Tobacco Use  . Smoking status: Never Smoker  . Smokeless tobacco: Never Used  Substance and Sexual Activity  . Alcohol use: Yes    Alcohol/week: 2.4 - 3.0 oz    Types: 4 - 5 Cans of beer per week  . Drug use: No  . Sexual activity: Yes    Partners: Female  Lifestyle  . Physical activity:    Days per week: Not on file    Minutes per session: Not on file  . Stress: Not on file  Relationships  . Social connections:    Talks on phone: Not on file    Gets together: Not on file    Attends religious service: Not on file    Active member of club or organization: Not on file    Attends meetings of clubs or organizations: Not on file    Relationship status: Not on file  .  Intimate partner violence:    Fear of current or ex partner: Not on file    Emotionally abused: Not on file    Physically abused: Not on file    Forced sexual activity: Not on file  Other Topics Concern  . Not on file  Social History Narrative   Live in [place]; originally from [place]   Works in Nurse, learning disability   Married, two children 2.5 and 8   Hobbies: spending time with kids, sports, martial arts, cars    Past Surgical History:  Procedure Laterality Date  . crack ribs Right 07/30/2016  . FRACTURE SURGERY Left 1993   wrist  . TONSILLECTOMY      Family History  Problem Relation Age of Onset  . Arthritis Mother   . Diverticulitis Mother   . Cancer Father        don't know the type, possible kidney/lung and mets  . Diabetes Paternal Grandfather     PMHx, SurgHx, SocialHx, FamHx,  Medications, and Allergies were reviewed in the Visit Navigator and updated as appropriate.   Patient Active Problem List   Diagnosis Date Noted  . Hyperhidrosis 08/26/2017  . Anxiety 07/03/2017  . Costochondral separation 10/24/2016    Social History   Tobacco Use  . Smoking status: Never Smoker  . Smokeless tobacco: Never Used  Substance Use Topics  . Alcohol use: Yes    Alcohol/week: 2.4 - 3.0 oz    Types: 4 - 5 Cans of beer per week  . Drug use: No    Current Medications and Allergies:    Current Outpatient Medications:  .  acetaminophen (TYLENOL) 500 MG tablet, Take 1,000 mg by mouth every 6 (six) hours as needed., Disp: , Rfl:  .  DRYSOL 20 % external solution, APPLY TO AFFECTED AREA EVERY DAY AT BEDTIME, Disp: 60 mL, Rfl: 0 .  HYPERCARE 15 % SOLN, APPLY 1 DOSE TOPICALLY DAILY., Disp: 70 mL, Rfl: 1 .  Multiple Vitamin (MULTIVITAMIN) tablet, Take 1 tablet by mouth daily., Disp: , Rfl:  .  sertraline (ZOLOFT) 25 MG tablet, Take 1 tablet (25 mg total) by mouth daily., Disp: 90 tablet, Rfl: 1  No Known Allergies  Review of Systems   Review of Systems  Constitutional: Negative for irritability.  Respiratory: Negative for shortness of breath.   Cardiovascular: Negative for chest pain and palpitations.  Gastrointestinal: Negative for nausea.  Neurological: Negative for dizziness.  Psychiatric/Behavioral: Negative for confusion, decreased concentration and suicidal ideas. The patient is not nervous/anxious and does not have insomnia.     Vitals:   Vitals:   01/16/18 0830  BP: 138/90  Pulse: 69  Temp: 97.9 F (36.6 C)  TempSrc: Oral  SpO2: 97%  Weight: 189 lb (85.7 kg)  Height: 5' 11.5" (1.816 m)     Body mass index is 25.99 kg/m.   Physical Exam:    Physical Exam  Constitutional: He appears well-developed. He is cooperative.  Non-toxic appearance. He does not have a sickly appearance. He does not appear ill. No distress.  Cardiovascular: Normal rate,  regular rhythm, S1 normal, S2 normal, normal heart sounds and normal pulses.  No LE edema  Pulmonary/Chest: Effort normal and breath sounds normal.  Abdominal: Normal appearance.  Neurological: He is alert. GCS eye subscore is 4. GCS verbal subscore is 5. GCS motor subscore is 6.  Skin: Skin is warm, dry and intact.  Psychiatric: He has a normal mood and affect. His speech is normal and behavior is  normal.  Nursing note and vitals reviewed.      Assessment and Plan:    Jaterrius was seen today for anxiety follow up.  Diagnoses and all orders for this visit:  Anxiety He is doing very well with his Zoloft 25 mg tablet.  Continue this.  We did briefly discuss possibly changing the time of the day that he takes it in order to help with some midday fatigue he is experiencing.  Follow-up in 6 months for a physical and we will readdress anxiety at this time.  Always can come back sooner if needed.  Abdominal bloating We discussed trialing a low FODMAP diet, I gave him information on this today.  I also recommended a daily probiotic.  Continue pushing fluids and exercise.  Follow-up if symptoms worsen or persist despite these measures.  Other orders -     sertraline (ZOLOFT) 25 MG tablet; Take 1 tablet (25 mg total) by mouth daily.    . Reviewed expectations re: course of current medical issues. . Discussed self-management of symptoms. . Outlined signs and symptoms indicating need for more acute intervention. . Patient verbalized understanding and all questions were answered. . See orders for this visit as documented in the electronic medical record. . Patient received an After Visit Summary.  CMA or LPN served as scribe during this visit. History, Physical, and Plan performed by medical provider. Documentation and orders reviewed and attested to.   Inda Coke, PA-C [place] 01/16/2018  Follow-up: Return in about 6 months (around 07/19/2018).

## 2018-03-25 DIAGNOSIS — D225 Melanocytic nevi of trunk: Secondary | ICD-10-CM | POA: Diagnosis not present

## 2018-03-25 DIAGNOSIS — D1801 Hemangioma of skin and subcutaneous tissue: Secondary | ICD-10-CM | POA: Diagnosis not present

## 2018-03-25 DIAGNOSIS — D2262 Melanocytic nevi of left upper limb, including shoulder: Secondary | ICD-10-CM | POA: Diagnosis not present

## 2018-03-25 DIAGNOSIS — D485 Neoplasm of uncertain behavior of skin: Secondary | ICD-10-CM | POA: Diagnosis not present

## 2018-03-27 ENCOUNTER — Other Ambulatory Visit: Payer: Self-pay | Admitting: Physician Assistant

## 2018-05-26 ENCOUNTER — Encounter: Payer: Self-pay | Admitting: Physician Assistant

## 2018-05-26 ENCOUNTER — Encounter: Payer: BLUE CROSS/BLUE SHIELD | Admitting: Physician Assistant

## 2018-05-26 ENCOUNTER — Ambulatory Visit (INDEPENDENT_AMBULATORY_CARE_PROVIDER_SITE_OTHER): Payer: BLUE CROSS/BLUE SHIELD | Admitting: Physician Assistant

## 2018-05-26 VITALS — BP 130/88 | HR 64 | Temp 98.4°F | Ht 71.5 in | Wt 185.2 lb

## 2018-05-26 DIAGNOSIS — R252 Cramp and spasm: Secondary | ICD-10-CM | POA: Diagnosis not present

## 2018-05-26 DIAGNOSIS — Z125 Encounter for screening for malignant neoplasm of prostate: Secondary | ICD-10-CM | POA: Diagnosis not present

## 2018-05-26 DIAGNOSIS — R140 Abdominal distension (gaseous): Secondary | ICD-10-CM

## 2018-05-26 DIAGNOSIS — E785 Hyperlipidemia, unspecified: Secondary | ICD-10-CM | POA: Diagnosis not present

## 2018-05-26 DIAGNOSIS — F419 Anxiety disorder, unspecified: Secondary | ICD-10-CM

## 2018-05-26 DIAGNOSIS — Z0001 Encounter for general adult medical examination with abnormal findings: Secondary | ICD-10-CM

## 2018-05-26 DIAGNOSIS — Z0000 Encounter for general adult medical examination without abnormal findings: Secondary | ICD-10-CM

## 2018-05-26 LAB — LIPID PANEL
Cholesterol: 241 mg/dL — ABNORMAL HIGH (ref 0–200)
HDL: 79.7 mg/dL (ref >39.00)
LDL CALC: 135 mg/dL — AB (ref 0–99)
NONHDL: 161.68
Total CHOL/HDL Ratio: 3
Triglycerides: 133 mg/dL (ref 0.0–149.0)
VLDL: 26.6 mg/dL (ref 0.0–40.0)

## 2018-05-26 LAB — COMPREHENSIVE METABOLIC PANEL
ALT: 18 U/L (ref 0–53)
AST: 22 U/L (ref 0–37)
Albumin: 4.7 g/dL (ref 3.5–5.2)
Alkaline Phosphatase: 36 U/L — ABNORMAL LOW (ref 39–117)
BUN: 16 mg/dL (ref 6–23)
CO2: 31 mEq/L (ref 19–32)
Calcium: 9.6 mg/dL (ref 8.4–10.5)
Chloride: 100 mEq/L (ref 96–112)
Creatinine, Ser: 1.08 mg/dL (ref 0.40–1.50)
GFR: 79.63 mL/min (ref >60.00)
Glucose, Bld: 96 mg/dL (ref 70–99)
Potassium: 4.6 mEq/L (ref 3.5–5.1)
Sodium: 138 mEq/L (ref 135–145)
Total Bilirubin: 0.4 mg/dL (ref 0.2–1.2)
Total Protein: 7 g/dL (ref 6.0–8.3)

## 2018-05-26 LAB — CBC WITH DIFFERENTIAL/PLATELET
Basophils Absolute: 0 10*3/uL (ref 0.0–0.1)
Basophils Relative: 0.4 % (ref 0.0–3.0)
Eosinophils Absolute: 0 10*3/uL (ref 0.0–0.7)
Eosinophils Relative: 0.4 % (ref 0.0–5.0)
HCT: 43.2 % (ref 39.0–52.0)
Hemoglobin: 14.9 g/dL (ref 13.0–17.0)
Lymphocytes Relative: 31.4 % (ref 12.0–46.0)
Lymphs Abs: 2.4 10*3/uL (ref 0.7–4.0)
MCHC: 34.5 g/dL (ref 30.0–36.0)
MCV: 83.2 fl (ref 78.0–100.0)
Monocytes Absolute: 0.4 10*3/uL (ref 0.1–1.0)
Monocytes Relative: 5.5 % (ref 3.0–12.0)
Neutro Abs: 4.7 10*3/uL (ref 1.4–7.7)
Neutrophils Relative %: 62.3 % (ref 43.0–77.0)
Platelets: 222 10*3/uL (ref 150.0–400.0)
RBC: 5.19 Mil/uL (ref 4.22–5.81)
RDW: 13.6 % (ref 11.5–15.5)
WBC: 7.5 10*3/uL (ref 4.0–10.5)

## 2018-05-26 LAB — CK: Total CK: 138 U/L (ref 7–232)

## 2018-05-26 LAB — PSA: PSA: 0.39 ng/mL (ref 0.10–4.00)

## 2018-05-26 NOTE — Progress Notes (Signed)
I acted as a Education administrator for [employer], PA-C Anselmo Pickler, LPN  Subjective:    Bernard Mora is a 42 y.o. male and is here for a comprehensive physical exam.  HPI  There are no preventive care reminders to display for this patient.  Acute Concerns: Leg cramps -- has had some occasional leg cramps in bilateral legs. Does a lot of kickboxing. Drinks a lot of water. Drinks a recovery drink a few days a week, FitAid. Denies warmth, redness, history of blood clots, CP and SOB.  Chronic Issues: Abdominal bloating -- diet avoidance of lactose has helped significantly, taking Probiotic tablet daily that is also helping. Anxiety -- feels like the Zoloft is helping tremendously  Health Maintenance: Immunizations -- UTD, declined Flu Colonoscopy -- N/A PSA -- done 05/2017 normal 0.52 Diet -- very well balanced, avoids dairy Caffeine intake -- minimal, coffee daily Sleep habits -- better Exercise -- very active, kickboxing Weight -- Weight: 185 lb 4 oz (84 kg)  Weight history Wt Readings from Last 10 Encounters:  05/26/18 185 lb 4 oz (84 kg)  01/16/18 189 lb (85.7 kg)  08/26/17 188 lb 9.6 oz (85.5 kg)  07/03/17 185 lb (83.9 kg)  05/23/17 180 lb (81.6 kg)  10/18/16 183 lb (83 kg)  10/18/16 183 lb (83 kg)  07/30/16 180 lb (81.6 kg)   Mood -- better with Zoloft Tobacco use -- none Alcohol use --- no excessive intake  Depression screen PHQ 2/9 05/26/2018  Decreased Interest 0  Down, Depressed, Hopeless 0  PHQ - 2 Score 0   GAD 7 : Generalized Anxiety Score 05/26/2018 01/16/2018 07/03/2017  Nervous, Anxious, on Edge 0 0 0  Control/stop worrying 0 0 0  Worry too much - different things 0 0 0  Trouble relaxing 1 0 1  Restless 0 0 0  Easily annoyed or irritable 0 0 0  Afraid - awful might happen 0 0 0  Total GAD 7 Score 1 0 1  Anxiety Difficulty Not difficult at all Not difficult at all Not difficult at all   Other providers/specialists: Dermatology  Dentist Eye  doctor   PMHx, SurgHx, SocialHx, Medications, and Allergies were reviewed in the Visit Navigator and updated as appropriate.   Past Medical History:  Diagnosis Date  . Allergy   . Hyperlipidemia      Past Surgical History:  Procedure Laterality Date  . crack ribs Right 07/30/2016  . FRACTURE SURGERY Left 1993   wrist  . TONSILLECTOMY       Family History  Problem Relation Age of Onset  . Arthritis Mother   . Diverticulitis Mother        did require colostomy  . Cancer Father        don't know the type, possible kidney/lung and mets  . Other Brother        skin lesions  . Diabetes Paternal Grandfather     Social History   Tobacco Use  . Smoking status: Never Smoker  . Smokeless tobacco: Never Used  Substance Use Topics  . Alcohol use: Yes    Alcohol/week: 4.0 - 5.0 standard drinks    Types: 4 - 5 Cans of beer per week  . Drug use: No    Review of Systems:   Review of Systems  Constitutional: Negative.  Negative for chills, fever, malaise/fatigue and weight loss.  HENT: Negative.  Negative for hearing loss, sinus pain and sore throat.   Eyes: Negative.  Negative for blurred vision.  Respiratory: Negative.  Negative for cough and shortness of breath.   Cardiovascular: Negative.  Negative for chest pain, palpitations and leg swelling.  Gastrointestinal: Negative.  Negative for abdominal pain, constipation, diarrhea, heartburn, nausea and vomiting.  Genitourinary: Negative.  Negative for dysuria, frequency and urgency.  Musculoskeletal: Negative.  Negative for back pain, myalgias and neck pain.  Skin: Negative.  Negative for itching and rash.  Neurological: Negative.  Negative for dizziness, tingling, seizures, loss of consciousness and headaches.  Endo/Heme/Allergies: Negative.  Negative for polydipsia.  Psychiatric/Behavioral: Negative.  Negative for depression. The patient is not nervous/anxious.     Objective:   Vitals:   05/26/18 1113  BP: 130/88   Pulse: 64  Temp: 98.4 F (36.9 C)  SpO2: 99%   Body mass index is 25.48 kg/m.  General Appearance:  Alert, cooperative, no distress, appears stated age  Head:  Normocephalic, without obvious abnormality, atraumatic  Eyes:  PERRL, conjunctiva/corneas clear, EOM's intact, fundi benign, both eyes       Ears:  Normal TM's and external ear canals, both ears  Nose: Nares normal, septum midline, mucosa normal, no drainage    or sinus tenderness  Throat: Lips, mucosa, and tongue normal; teeth and gums normal  Neck: Supple, symmetrical, trachea midline, no adenopathy; thyroid:  No enlargement/tenderness/nodules; no carotit bruit or JVD  Back:   Symmetric, no curvature, ROM normal, no CVA tenderness  Lungs:   Clear to auscultation bilaterally, respirations unlabored  Chest wall:  No tenderness or deformity  Heart:  Regular rate and rhythm, S1 and S2 normal, no murmur, rub   or gallop  Abdomen:   Soft, non-tender, bowel sounds active all four quadrants, no masses, no organomegaly  Extremities: Extremities normal, atraumatic, no cyanosis or edema; bilateral calves without erythema and LE without any swelling  Prostate: Not done.   Skin: Skin color, texture, turgor normal, no rashes or lesions  Lymph nodes: Cervical, supraclavicular, and axillary nodes normal  Neurologic: CNII-XII grossly intact. Normal strength, sensation and reflexes throughout    Assessment/Plan:   [name] was seen today for annual exam.  Diagnoses and all orders for this visit:  Encounter for general adult medical examination with abnormal findings Today patient counseled on age appropriate routine health concerns for screening and prevention, each reviewed and up to date or declined. Immunizations reviewed and up to date or declined. Labs ordered and reviewed. Risk factors for depression reviewed and negative. Hearing function and visual acuity are intact. ADLs screened and addressed as needed. Functional ability and level  of safety reviewed and appropriate. Education, counseling and referrals performed based on assessed risks today.   Leg cramps Suspect due to overuse with kickboxing. Discussed adequate hydration and electrolyte replacement after vigorous activity lasting >45 min. Will check labs as well as CK. Follow-up if symptoms persist. If labs normal, discussed starting possible OTC magnesium supplement. -     CK (Creatine Kinase)  Hyperlipidemia, unspecified hyperlipidemia type -     Lipid panel  Prostate cancer screening -     PSA  Anxiety Well controlled with Zoloft 25 mg. Follow-up in 6 months. -     CBC with Differential/Platelet -     Comprehensive metabolic panel  Abdominal bloating Well controlled with dietary changes and probiotic.   Well Adult Exam: Labs ordered: Yes. Patient counseling was done. See below for items discussed. Discussed the patient's BMI.  The BMI is in the acceptable range Follow up in 6 months.  Patient Counseling: [x]   Nutrition:  Stressed importance of moderation in sodium/caffeine intake, saturated fat and cholesterol, caloric balance, sufficient intake of fresh fruits, vegetables, and fiber.  [x]   Stressed the importance of regular exercise.   []   Substance Abuse: Discussed cessation/primary prevention of tobacco, alcohol, or other drug use; driving or other dangerous activities under the influence; availability of treatment for abuse.   [x]   Injury prevention: Discussed safety belts, safety helmets, smoke detector, smoking near bedding or upholstery.   []   Sexuality: Discussed sexually transmitted diseases, partner selection, use of condoms, avoidance of unintended pregnancy  and contraceptive alternatives.   [x]   Dental health: Discussed importance of regular tooth brushing, flossing, and dental visits.  [x]   Health maintenance and immunizations reviewed. Please refer to Health maintenance section.    CMA or LPN served as scribe during this visit. History,  Physical, and Plan performed by medical provider. The above documentation has been reviewed and is accurate and complete.  Inda Coke, PA-C [facility]

## 2018-05-26 NOTE — Patient Instructions (Signed)
It was great to see you! ? ?Please go to the lab for blood work.  ? ?Our office will call you with your results unless you have chosen to receive results via MyChart. ? ?If your blood work is normal we will follow-up each year for physicals and as scheduled for chronic medical problems. ? ?If anything is abnormal we will treat accordingly and get you in for a follow-up. ? ?Take care, ? ?Katilyn Miltenberger ?  ? ? ?

## 2018-05-28 ENCOUNTER — Other Ambulatory Visit: Payer: Self-pay | Admitting: Physician Assistant

## 2018-06-30 ENCOUNTER — Telehealth: Payer: Self-pay | Admitting: Physician Assistant

## 2018-06-30 ENCOUNTER — Telehealth: Payer: Self-pay | Admitting: *Deleted

## 2018-06-30 MED ORDER — SERTRALINE HCL 50 MG PO TABS: 50.0000 mg | ORAL_TABLET | Freq: Every day | ORAL | 0 refills | Status: DC

## 2018-06-30 NOTE — Addendum Note (Signed)
Addended by: Marian Sorrow on: 06/30/2018 03:05 PM   Modules accepted: Orders

## 2018-06-30 NOTE — Telephone Encounter (Signed)
See note

## 2018-06-30 NOTE — Telephone Encounter (Signed)
Okay to send in 90 tabs for Zoloft 50 mg. Follow-up in 3 months. Sooner if symptoms.

## 2018-06-30 NOTE — Telephone Encounter (Unsigned)
Copied from [place] [id number]. Topic: General - Other >> Jun 30, 2018 12:22 PM Carolyn Stare wrote:  Pt called today to ask if the below med can be increased to 50mg  a day   sertraline (ZOLOFT) 25 MG tablet

## 2018-06-30 NOTE — Telephone Encounter (Signed)
Spoke to pt, told him new Rx for Zoloft 50 mg was sent to pharmacy and to follow up in 3 months per [facility]. Pt verbalized understanding.

## 2018-06-30 NOTE — Telephone Encounter (Signed)
Please see message and advise if okay to send new Rx in for Zoloft 50 mg daily?

## 2018-06-30 NOTE — Telephone Encounter (Signed)
See note. There is another telephone note regarding this as well

## 2018-06-30 NOTE — Telephone Encounter (Signed)
Call place to patient. Left VM to please call office. He will need to speak with a nurse and an appointment before we can increase dosage of Zoloft per his request.

## 2018-06-30 NOTE — Telephone Encounter (Signed)
TC to patient. He is requesting to increase Zoloft from 25 MG to 50 MG daily. He stated at his [place] on 05/26/18 he and physician agreed he could try taking 50 MG daily with the Zoloft he is taking. For the past 4-5 days he has taken Zoloft 25 MG 2 tabs daily and is noticing an improvement, already. He denies any negative thoughts at this time.  He would like Zoloft 50 MG phoned to pharmacy.  [facility].

## 2018-07-25 ENCOUNTER — Other Ambulatory Visit: Payer: Self-pay | Admitting: Physician Assistant

## 2018-08-12 ENCOUNTER — Ambulatory Visit: Payer: BLUE CROSS/BLUE SHIELD | Admitting: Physician Assistant

## 2018-08-12 ENCOUNTER — Encounter: Payer: Self-pay | Admitting: Physician Assistant

## 2018-08-12 VITALS — BP 116/78 | HR 80 | Temp 98.6°F | Ht 71.5 in | Wt 182.2 lb

## 2018-08-12 DIAGNOSIS — J0110 Acute frontal sinusitis, unspecified: Secondary | ICD-10-CM | POA: Diagnosis not present

## 2018-08-12 MED ORDER — METHYLPREDNISOLONE ACETATE 80 MG/ML IJ SUSP
80.0000 mg | Freq: Once | INTRAMUSCULAR | Status: AC
  Administered 2018-08-12: 80 mg via INTRAMUSCULAR

## 2018-08-12 MED ORDER — AMOXICILLIN-POT CLAVULANATE 875-125 MG PO TABS: 1.0000 | ORAL_TABLET | Freq: Two times a day (BID) | ORAL | 0 refills | Status: DC

## 2018-08-12 NOTE — Patient Instructions (Signed)
It was great to see you!  You received a depo-medrol injection today.  If symptoms are not improved by day 10, start Augmentin antibiotic.  Push fluids and get plenty of rest. Please return if you are not improving as expected, or if you have high fevers (>101.5) or difficulty swallowing or worsening productive cough.  Call clinic with questions.  I hope you start feeling better soon!

## 2018-08-12 NOTE — Progress Notes (Signed)
Bernard Mora is a 42 y.o. male here for a new problem.  History of Present Illness:   Chief Complaint  Patient presents with  . green sinus drainage  . Cough    Cough  This is a new problem. The current episode started in the past 7 days. The problem has been waxing and waning. The problem occurs every few hours. The cough is productive of sputum. Associated symptoms include ear congestion, ear pain, headaches, nasal congestion, postnasal drip, rhinorrhea and a sore throat. Pertinent negatives include no chest pain, chills or shortness of breath. Nothing aggravates the symptoms. He has tried OTC cough suppressant for the symptoms. The treatment provided mild relief. There is no history of bronchitis, COPD, emphysema or pneumonia. Does have hx of sinusitis.     Past Medical History:  Diagnosis Date  . Allergy   . Hyperlipidemia      Social History   Socioeconomic History  . Marital status: Married    Spouse name: Not on file  . Number of children: Not on file  . Years of education: Not on file  . Highest education level: Not on file  Occupational History  . Not on file  Social Needs  . Financial resource strain: Not on file  . Food insecurity:    Worry: Not on file    Inability: Not on file  . Transportation needs:    Medical: Not on file    Non-medical: Not on file  Tobacco Use  . Smoking status: Never Smoker  . Smokeless tobacco: Never Used  Substance and Sexual Activity  . Alcohol use: Yes    Alcohol/week: 4.0 - 5.0 standard drinks    Types: 4 - 5 Cans of beer per week  . Drug use: No  . Sexual activity: Yes    Partners: Female  Lifestyle  . Physical activity:    Days per week: Not on file    Minutes per session: Not on file  . Stress: Not on file  Relationships  . Social connections:    Talks on phone: Not on file    Gets together: Not on file    Attends religious service: Not on file    Active member of club or organization: Not on file    Attends meetings  of clubs or organizations: Not on file    Relationship status: Not on file  . Intimate partner violence:    Fear of current or ex partner: Not on file    Emotionally abused: Not on file    Physically abused: Not on file    Forced sexual activity: Not on file  Other Topics Concern  . Not on file  Social History Narrative   Live in [place]; originally from [place]   Works in Nurse, learning disability   Married, two children 2.5 and 8   Hobbies: spending time with kids, sports, martial arts, cars    Past Surgical History:  Procedure Laterality Date  . crack ribs Right 07/30/2016  . FRACTURE SURGERY Left 1993   wrist  . TONSILLECTOMY      Family History  Problem Relation Age of Onset  . Arthritis Mother   . Diverticulitis Mother        did require colostomy  . Cancer Father        don't know the type, possible kidney/lung and mets  . Other Brother        skin lesions  . Diabetes Paternal Grandfather     No  Known Allergies  Current Medications:   Current Outpatient Medications:  .  DRYSOL 20 % external solution, APPLY TO AFFECTED AREA EVERY DAY AT BEDTIME, Disp: 60 mL, Rfl: 0 .  HYPERCARE 15 % SOLN, APPLY 1 DOSE TOPICALLY DAILY., Disp: 60 mL, Rfl: 1 .  ibuprofen (ADVIL,MOTRIN) 200 MG tablet, Take 200 mg by mouth every 6 (six) hours as needed., Disp: , Rfl:  .  Multiple Vitamin (MULTIVITAMIN) tablet, Take 1 tablet by mouth daily., Disp: , Rfl:  .  Probiotic Product (PROBIOTIC DAILY PO), Take 1 tablet by mouth daily., Disp: , Rfl:  .  sertraline (ZOLOFT) 50 MG tablet, Take 1 tablet (50 mg total) by mouth daily., Disp: 90 tablet, Rfl: 0 .  amoxicillin-clavulanate (AUGMENTIN) 875-125 MG tablet, Take 1 tablet by mouth 2 (two) times daily., Disp: 20 tablet, Rfl: 0  Current Facility-Administered Medications:  .  methylPREDNISolone acetate (DEPO-MEDROL) injection 80 mg, 80 mg, Intramuscular, Once, Morene Rankins, [place], PA   Review of Systems:   Review of Systems  Constitutional:  Negative for chills.  HENT: Positive for ear pain, postnasal drip, rhinorrhea and sore throat.   Respiratory: Positive for cough. Negative for shortness of breath.   Cardiovascular: Negative for chest pain.  Neurological: Positive for headaches.    Vitals:   Vitals:   08/12/18 0957  BP: 116/78  Pulse: 80  Temp: 98.6 F (37 C)  TempSrc: Oral  SpO2: 97%  Weight: 182 lb 3.2 oz (82.6 kg)  Height: 5' 11.5" (1.816 m)     Body mass index is 25.06 kg/m.  Physical Exam:   Physical Exam Vitals signs and nursing note reviewed.  Constitutional:      General: He is not in acute distress.    Appearance: He is well-developed. He is not ill-appearing or toxic-appearing.  HENT:     Head: Normocephalic and atraumatic.     Right Ear: Tympanic membrane, ear canal and external ear normal. Tympanic membrane is not erythematous, retracted or bulging.     Left Ear: Tympanic membrane, ear canal and external ear normal. Tympanic membrane is not erythematous, retracted or bulging.     Nose:     Right Sinus: Maxillary sinus tenderness present. No frontal sinus tenderness.     Left Sinus: Maxillary sinus tenderness present. No frontal sinus tenderness.     Mouth/Throat:     Mouth: Mucous membranes are moist.     Pharynx: Uvula midline. Posterior oropharyngeal erythema present.     Tonsils: No tonsillar exudate. Swelling: 0 on the right. 0 on the left.  Eyes:     General: Lids are normal.     Conjunctiva/sclera: Conjunctivae normal.  Neck:     Trachea: Trachea normal.  Cardiovascular:     Rate and Rhythm: Normal rate and regular rhythm.     Heart sounds: Normal heart sounds, S1 normal and S2 normal.  Pulmonary:     Effort: Pulmonary effort is normal.     Breath sounds: Normal breath sounds. No decreased breath sounds, wheezing, rhonchi or rales.  Lymphadenopathy:     Cervical: No cervical adenopathy.  Skin:    General: Skin is warm and dry.  Neurological:     Mental Status: He is alert.   Psychiatric:        Speech: Speech normal.        Behavior: Behavior normal. Behavior is cooperative.     Assessment and Plan:   Dayan was seen today for green sinus drainage and cough.  Diagnoses and all  orders for this visit:  Acute non-recurrent frontal sinusitis No red flags on exam.  Suspect viral sinusitis. Will treat with Depo-Medrol injection at this time. Discussed that if symptoms do not improve, start oral antibiotic -- Augmentin. Discussed taking medications as prescribed. Reviewed return precautions including worsening fever, SOB, worsening cough or other concerns. Push fluids and rest. I recommend that patient follow-up if symptoms worsen or persist despite treatment x 7-10 days, sooner if needed. -     methylPREDNISolone acetate (DEPO-MEDROL) injection 80 mg  Other orders -     amoxicillin-clavulanate (AUGMENTIN) 875-125 MG tablet; Take 1 tablet by mouth 2 (two) times daily.  . Reviewed expectations re: course of current medical issues. . Discussed self-management of symptoms. . Outlined signs and symptoms indicating need for more acute intervention. . Patient verbalized understanding and all questions were answered. . See orders for this visit as documented in the electronic medical record. . Patient received an After-Visit Summary.  CMA or LPN served as scribe during this visit. History, Physical, and Plan performed by medical provider. The above documentation has been reviewed and is accurate and complete.  Inda Coke, PA-C

## 2018-09-21 ENCOUNTER — Other Ambulatory Visit: Payer: Self-pay | Admitting: Physician Assistant

## 2018-09-22 ENCOUNTER — Other Ambulatory Visit: Payer: Self-pay | Admitting: Physician Assistant

## 2018-10-01 ENCOUNTER — Other Ambulatory Visit: Payer: Self-pay | Admitting: *Deleted

## 2018-10-01 MED ORDER — ALUMINUM CHLORIDE IN ALCOHOL 15 % EX SOLN: 1.0000 "application " | Freq: Every day | CUTANEOUS | 1 refills | Status: DC

## 2018-10-28 ENCOUNTER — Ambulatory Visit: Payer: BLUE CROSS/BLUE SHIELD | Admitting: Physician Assistant

## 2018-10-28 ENCOUNTER — Encounter: Payer: Self-pay | Admitting: Physician Assistant

## 2018-10-28 VITALS — BP 130/98 | HR 61 | Temp 98.0°F | Ht 71.5 in | Wt 183.0 lb

## 2018-10-28 DIAGNOSIS — R05 Cough: Secondary | ICD-10-CM

## 2018-10-28 DIAGNOSIS — R059 Cough, unspecified: Secondary | ICD-10-CM

## 2018-10-28 MED ORDER — AZITHROMYCIN 250 MG PO TABS: ORAL_TABLET | ORAL | 0 refills | Status: DC

## 2018-10-28 MED ORDER — METHYLPREDNISOLONE ACETATE 80 MG/ML IJ SUSP
80.0000 mg | Freq: Once | INTRAMUSCULAR | Status: AC
  Administered 2018-10-28: 80 mg via INTRAMUSCULAR

## 2018-10-28 NOTE — Progress Notes (Signed)
Bernard Mora is a 43 y.o. male here for a new problem.  History of Present Illness:   Chief Complaint  Patient presents with  . Nasal Congestion    x2week  . Headache    x2-3week  . Cough    wet cough, yellowish phlegm mucus    HPI     Past Medical History:  Diagnosis Date  . Allergy   . Hyperlipidemia      Social History   Socioeconomic History  . Marital status: Married    Spouse name: Not on file  . Number of children: Not on file  . Years of education: Not on file  . Highest education level: Not on file  Occupational History  . Not on file  Social Needs  . Financial resource strain: Not on file  . Food insecurity:    Worry: Not on file    Inability: Not on file  . Transportation needs:    Medical: Not on file    Non-medical: Not on file  Tobacco Use  . Smoking status: Never Smoker  . Smokeless tobacco: Never Used  Substance and Sexual Activity  . Alcohol use: Yes    Alcohol/week: 4.0 - 5.0 standard drinks    Types: 4 - 5 Cans of beer per week  . Drug use: No  . Sexual activity: Yes    Partners: Female  Lifestyle  . Physical activity:    Days per week: Not on file    Minutes per session: Not on file  . Stress: Not on file  Relationships  . Social connections:    Talks on phone: Not on file    Gets together: Not on file    Attends religious service: Not on file    Active member of club or organization: Not on file    Attends meetings of clubs or organizations: Not on file    Relationship status: Not on file  . Intimate partner violence:    Fear of current or ex partner: Not on file    Emotionally abused: Not on file    Physically abused: Not on file    Forced sexual activity: Not on file  Other Topics Concern  . Not on file  Social History Narrative   Live in [place]; originally from [place]   Works in Nurse, learning disability   Married, two children 2.5 and 8   Hobbies: spending time with kids, sports, martial arts, cars    Past Surgical  History:  Procedure Laterality Date  . crack ribs Right 07/30/2016  . FRACTURE SURGERY Left 1993   wrist  . TONSILLECTOMY      Family History  Problem Relation Age of Onset  . Arthritis Mother   . Diverticulitis Mother        did require colostomy  . Cancer Father        don't know the type, possible kidney/lung and mets  . Other Brother        skin lesions  . Diabetes Paternal Grandfather     No Known Allergies  Current Medications:   Current Outpatient Medications:  .  Aluminum Chloride in Alcohol (HYPERCARE) 15 % SOLN, Apply 1 application topically daily., Disp: 60 mL, Rfl: 1 .  DRYSOL 20 % external solution, APPLY TO AFFECTED AREA EVERY DAY AT BEDTIME, Disp: 60 mL, Rfl: 0 .  guaiFENesin (MUCINEX) 600 MG 12 hr tablet, Take by mouth 2 (two) times daily., Disp: , Rfl:  .  ibuprofen (ADVIL,MOTRIN) 200 MG  tablet, Take 200 mg by mouth every 6 (six) hours as needed., Disp: , Rfl:  .  Multiple Vitamin (MULTIVITAMIN) tablet, Take 1 tablet by mouth daily., Disp: , Rfl:  .  Probiotic Product (PROBIOTIC DAILY PO), Take 1 tablet by mouth daily., Disp: , Rfl:  .  sertraline (ZOLOFT) 50 MG tablet, TAKE 1 TABLET BY MOUTH EVERY DAY, Disp: 90 tablet, Rfl: 0 .  azithromycin (ZITHROMAX) 250 MG tablet, Take two tablets on day 1, then on daily thereafter, Disp: 6 each, Rfl: 0   Review of Systems:   Review of Systems  Constitutional: Negative for chills, fever, malaise/fatigue and weight loss.  HENT: Negative for ear discharge, ear pain, hearing loss and tinnitus.   Respiratory: Positive for cough. Negative for shortness of breath.   Cardiovascular: Negative for chest pain, orthopnea, claudication and leg swelling.  Gastrointestinal: Negative for heartburn, nausea and vomiting.  Neurological: Negative for dizziness, tingling and headaches.      Vitals:   Vitals:   10/28/18 0834  BP: (!) 130/98  Pulse: 61  Temp: 98 F (36.7 C)  TempSrc: Oral  SpO2: 99%  Weight: 183 lb (83 kg)   Height: 5' 11.5" (1.816 m)     Body mass index is 25.17 kg/m.  Physical Exam:   Physical Exam Vitals signs and nursing note reviewed.  Constitutional:      General: He is not in acute distress.    Appearance: He is well-developed. He is not ill-appearing or toxic-appearing.  HENT:     Head: Normocephalic and atraumatic.     Right Ear: Tympanic membrane, ear canal and external ear normal. Tympanic membrane is not erythematous, retracted or bulging.     Left Ear: Tympanic membrane, ear canal and external ear normal. Tympanic membrane is not erythematous, retracted or bulging.     Nose: Mucosal edema, congestion and rhinorrhea present.     Right Sinus: Frontal sinus tenderness present. No maxillary sinus tenderness.     Left Sinus: Frontal sinus tenderness present. No maxillary sinus tenderness.     Mouth/Throat:     Lips: Pink.     Mouth: Mucous membranes are moist.     Pharynx: Uvula midline. Posterior oropharyngeal erythema present.     Tonsils: Swelling: 1+ on the right. 1+ on the left.  Eyes:     General: Lids are normal.     Conjunctiva/sclera: Conjunctivae normal.  Neck:     Trachea: Trachea normal.  Cardiovascular:     Rate and Rhythm: Normal rate and regular rhythm.     Pulses: Normal pulses.     Heart sounds: Normal heart sounds, S1 normal and S2 normal.     Comments: No LE edema Pulmonary:     Effort: Pulmonary effort is normal.     Breath sounds: Normal breath sounds. No decreased breath sounds, wheezing, rhonchi or rales.  Lymphadenopathy:     Cervical: No cervical adenopathy.  Skin:    General: Skin is warm and dry.  Neurological:     Mental Status: He is alert.     GCS: GCS eye subscore is 4. GCS verbal subscore is 5. GCS motor subscore is 6.  Psychiatric:        Speech: Speech normal.        Behavior: Behavior normal. Behavior is cooperative.      Assessment and Plan:   Kj was seen today for nasal congestion, headache and cough.  Diagnoses and  all orders for this visit:  Cough -  methylPREDNISolone acetate (DEPO-MEDROL) injection 80 mg  Other orders -     azithromycin (ZITHROMAX) 250 MG tablet; Take two tablets on day 1, then on daily thereafter   No red flags on exam.  Suspect sinusitis and bronchitis.  Will initiate azithromycin per orders. Discussed taking medications as prescribed. Reviewed return precautions including worsening fever, SOB, worsening cough or other concerns. Push fluids and rest. I recommend that patient follow-up if symptoms worsen or persist despite treatment x 7-10 days, sooner if needed.  . Reviewed expectations re: course of current medical issues. . Discussed self-management of symptoms. . Outlined signs and symptoms indicating need for more acute intervention. . Patient verbalized understanding and all questions were answered. . See orders for this visit as documented in the electronic medical record. . Patient received an After-Visit Summary.   Inda Coke, PA-C

## 2018-10-28 NOTE — Patient Instructions (Signed)
It was great to see you!  Use medication as prescribed: azithromycin antibiotic  Push fluids and get plenty of rest. Please return if you are not improving as expected, or if you have high fevers (>101.5) or difficulty swallowing or worsening productive cough.  Call clinic with questions.  I hope you start feeling better soon!

## 2018-11-12 ENCOUNTER — Other Ambulatory Visit: Payer: Self-pay | Admitting: Physician Assistant

## 2018-11-26 ENCOUNTER — Telehealth: Payer: Self-pay | Admitting: *Deleted

## 2018-11-26 NOTE — Telephone Encounter (Signed)
Left detailed message on personal voicemail due for follow up with [facility]. Please call office and schedule Virtual visit.

## 2018-12-14 ENCOUNTER — Other Ambulatory Visit: Payer: Self-pay | Admitting: Physician Assistant

## 2018-12-15 ENCOUNTER — Other Ambulatory Visit: Payer: Self-pay

## 2018-12-15 ENCOUNTER — Other Ambulatory Visit: Payer: Self-pay | Admitting: *Deleted

## 2018-12-15 ENCOUNTER — Ambulatory Visit (INDEPENDENT_AMBULATORY_CARE_PROVIDER_SITE_OTHER): Payer: BLUE CROSS/BLUE SHIELD | Admitting: Physician Assistant

## 2018-12-15 ENCOUNTER — Encounter: Payer: Self-pay | Admitting: Physician Assistant

## 2018-12-15 DIAGNOSIS — F419 Anxiety disorder, unspecified: Secondary | ICD-10-CM

## 2018-12-15 NOTE — Progress Notes (Signed)
Virtual Visit via Video   I connected with Bernard Mora on 12/15/18 at  2:00 PM EDT by a video enabled telemedicine application and verified that I am speaking with the correct person using two identifiers. Location patient: Home Location provider: [facility], Office Persons participating in the virtual visit: Lochlann Mastrangelo, Inda Coke, PA-C   I discussed the limitations of evaluation and management by telemedicine and the availability of in person appointments. The patient expressed understanding and agreed to proceed.  Subjective:   HPI:  Anxiety Pt for 6 month follow up. Pt is doing well, presently taking Zoloft 50 mg one tablet daily and tolerating. He has been doing relatively well with the COVID-19 outbreak -- his job was mostly from home prior to this. He is sleeping well now that he doesn't have to take his children to school early in the morning. He is still exercising. Denies SI/HI.  ROS: See pertinent positives and negatives per HPI.  Patient Active Problem List   Diagnosis Date Noted   Hyperhidrosis 08/26/2017   Anxiety 07/03/2017   Costochondral separation 10/24/2016    Social History   Tobacco Use   Smoking status: Never Smoker   Smokeless tobacco: Never Used  Substance Use Topics   Alcohol use: Yes    Alcohol/week: 4.0 - 5.0 standard drinks    Types: 4 - 5 Cans of beer per week    Current Outpatient Medications:    DRYSOL 20 % external solution, APPLY TO AFFECTED AREA EVERY DAY AT BEDTIME, Disp: 60 mL, Rfl: 0   HYPERCARE 15 % SOLN, APPLY TO AFFECTED AREA EVERY DAY, Disp: 60 mL, Rfl: 1   ibuprofen (ADVIL,MOTRIN) 200 MG tablet, Take 200 mg by mouth every 6 (six) hours as needed., Disp: , Rfl:    Multiple Vitamin (MULTIVITAMIN) tablet, Take 1 tablet by mouth daily., Disp: , Rfl:    Probiotic Product (PROBIOTIC DAILY PO), Take 1 tablet by mouth daily., Disp: , Rfl:    sertraline (ZOLOFT) 50 MG tablet, TAKE 1 TABLET BY MOUTH EVERY DAY, Disp: 90  tablet, Rfl: 1  No Known Allergies  Objective:   VITALS: Per patient if applicable, see vitals. GENERAL: Alert, appears well and in no acute distress. HEENT: Atraumatic, conjunctiva clear, no obvious abnormalities on inspection of external nose and ears. NECK: Normal movements of the head and neck. CARDIOPULMONARY: No increased WOB. Speaking in clear sentences. I:E ratio WNL.  MS: Moves all visible extremities without noticeable abnormality. PSYCH: Pleasant and cooperative, well-groomed. Speech normal rate and rhythm. Affect is appropriate. Insight and judgement are appropriate. Attention is focused, linear, and appropriate.  NEURO: CN grossly intact. Oriented as arrived to appointment on time with no prompting. Moves both UE equally.  SKIN: No obvious lesions, wounds, erythema, or cyanosis noted on face or hands.  Assessment and Plan:   Desmon was seen today for anxiety.  Diagnoses and all orders for this visit:  Anxiety   Stable. Follow-up in 1 year regarding anxiety -- sooner if needed. Continue Zoloft 50 mg.   Reviewed expectations re: course of current medical issues.  Discussed self-management of symptoms.  Outlined signs and symptoms indicating need for more acute intervention.  Patient verbalized understanding and all questions were answered.  Health Maintenance issues including appropriate healthy diet, exercise, and smoking avoidance were discussed with patient.  See orders for this visit as documented in the electronic medical record.  I discussed the assessment and treatment plan with the patient. The patient was provided an  opportunity to ask questions and all were answered. The patient agreed with the plan and demonstrated an understanding of the instructions.   The patient was advised to call back or seek an in-person evaluation if the symptoms worsen or if the condition fails to improve as anticipated.    [place], [place] 12/15/2018

## 2018-12-17 ENCOUNTER — Encounter: Payer: Self-pay | Admitting: Physician Assistant

## 2018-12-17 ENCOUNTER — Ambulatory Visit (INDEPENDENT_AMBULATORY_CARE_PROVIDER_SITE_OTHER): Payer: BLUE CROSS/BLUE SHIELD | Admitting: Physician Assistant

## 2018-12-17 DIAGNOSIS — S80862A Insect bite (nonvenomous), left lower leg, initial encounter: Secondary | ICD-10-CM

## 2018-12-17 DIAGNOSIS — W57XXXA Bitten or stung by nonvenomous insect and other nonvenomous arthropods, initial encounter: Secondary | ICD-10-CM

## 2018-12-17 MED ORDER — DOXYCYCLINE HYCLATE 100 MG PO TABS: 100.0000 mg | ORAL_TABLET | Freq: Two times a day (BID) | ORAL | 0 refills | Status: DC

## 2018-12-17 NOTE — Progress Notes (Signed)
Virtual Visit via Video   I connected with Bernard Mora on 12/17/18 at  2:40 PM EDT by a video enabled telemedicine application and verified that I am speaking with the correct person using two identifiers. Location patient: Home Location provider: [facility], Office Persons participating in the virtual visit: Bernard Mora, Inda Coke, [place]   I discussed the limitations of evaluation and management by telemedicine and the availability of in person appointments. The patient expressed understanding and agreed to proceed.  Subjective:   HPI:  Tick bite Pt c/o tick bite 6 days ago, left calf muscle. Pt removed the tick, cleaned with alcohol, using benadryl itch stopping gel.The area is red size of pinky finger and little hard area, itchy around the bite. Denies fever,chills. He states that he took about 45 minutes trying to remove the tick. Area has not changed/improved since he first noticed it.  ROS: See pertinent positives and negatives per HPI.  Patient Active Problem List   Diagnosis Date Noted  . Hyperhidrosis 08/26/2017  . Anxiety 07/03/2017  . Costochondral separation 10/24/2016    Social History   Tobacco Use  . Smoking status: Never Smoker  . Smokeless tobacco: Never Used  Substance Use Topics  . Alcohol use: Yes    Alcohol/week: 4.0 - 5.0 standard drinks    Types: 4 - 5 Cans of beer per week    Current Outpatient Medications:  .  DRYSOL 20 % external solution, APPLY TO AFFECTED AREA EVERY DAY AT BEDTIME, Disp: 60 mL, Rfl: 0 .  HYPERCARE 15 % SOLN, APPLY TO AFFECTED AREA EVERY DAY, Disp: 60 mL, Rfl: 1 .  ibuprofen (ADVIL,MOTRIN) 200 MG tablet, Take 200 mg by mouth every 6 (six) hours as needed., Disp: , Rfl:  .  Multiple Vitamin (MULTIVITAMIN) tablet, Take 1 tablet by mouth daily., Disp: , Rfl:  .  Probiotic Product (PROBIOTIC DAILY PO), Take 1 tablet by mouth daily., Disp: , Rfl:  .  sertraline (ZOLOFT) 50 MG tablet, TAKE 1 TABLET BY MOUTH EVERY DAY, Disp: 90  tablet, Rfl: 1 .  doxycycline (VIBRA-TABS) 100 MG tablet, Take 1 tablet (100 mg total) by mouth 2 (two) times daily., Disp: 20 tablet, Rfl: 0  No Known Allergies  Objective:   VITALS: Per patient if applicable, see vitals. GENERAL: Alert, appears well and in no acute distress. HEENT: Atraumatic, conjunctiva clear, no obvious abnormalities on inspection of external nose and ears. NECK: Normal movements of the head and neck. CARDIOPULMONARY: No increased WOB. Speaking in clear sentences. I:E ratio WNL.  MS: Moves all visible extremities without noticeable abnormality. PSYCH: Pleasant and cooperative, well-groomed. Speech normal rate and rhythm. Affect is appropriate. Insight and judgement are appropriate. Attention is focused, linear, and appropriate.  NEURO: CN grossly intact. Oriented as arrived to appointment on time with no prompting. Moves both UE equally.  SKIN: small erythematous area to medial upper left calf -- no tenderness (per patient) or obvious discharge  Assessment and Plan:   [name] was seen today for tick removal.  Diagnoses and all orders for this visit:  Insect bite of left lower leg, initial encounter  Other orders -     doxycycline (VIBRA-TABS) 100 MG tablet; Take 1 tablet (100 mg total) by mouth 2 (two) times daily.   Possible secondary skin infection, will cover for this with doxycycline. Encouraged patient to stop scratching and trying to explore area. Discussed continuing daily antihistamine. Worsening precautions advised.  . Reviewed expectations re: course of current medical issues. Marland Kitchen  Discussed self-management of symptoms. . Outlined signs and symptoms indicating need for more acute intervention. . Patient verbalized understanding and all questions were answered. Marland Kitchen Health Maintenance issues including appropriate healthy diet, exercise, and smoking avoidance were discussed with patient. . See orders for this visit as documented in the electronic medical  record.  I discussed the assessment and treatment plan with the patient. The patient was provided an opportunity to ask questions and all were answered. The patient agreed with the plan and demonstrated an understanding of the instructions.   The patient was advised to call back or seek an in-person evaluation if the symptoms worsen or if the condition fails to improve as anticipated.    [place], [place] 12/17/2018

## 2019-05-28 ENCOUNTER — Telehealth: Payer: Self-pay

## 2019-05-28 NOTE — Telephone Encounter (Signed)
Copied from [place] [phone] [id number]. Topic: General - Other >> May 28, 2019  1:27 PM Mcneil, Ja-Kwan wrote: Reason for CRM: Pt stated he would like to decrease the dosage of sertraline (ZOLOFT) to 25 MG. Pt requests call back

## 2019-05-29 ENCOUNTER — Ambulatory Visit (INDEPENDENT_AMBULATORY_CARE_PROVIDER_SITE_OTHER): Payer: BC Managed Care – PPO | Admitting: Physician Assistant

## 2019-05-29 ENCOUNTER — Encounter: Payer: Self-pay | Admitting: Physician Assistant

## 2019-05-29 ENCOUNTER — Encounter: Payer: BLUE CROSS/BLUE SHIELD | Admitting: Physician Assistant

## 2019-05-29 VITALS — Ht 71.5 in | Wt 185.0 lb

## 2019-05-29 DIAGNOSIS — F419 Anxiety disorder, unspecified: Secondary | ICD-10-CM

## 2019-05-29 MED ORDER — SERTRALINE HCL 25 MG PO TABS: 25.0000 mg | ORAL_TABLET | Freq: Every day | ORAL | 1 refills | Status: DC

## 2019-05-29 NOTE — Telephone Encounter (Signed)
Called pt and scheduled virtual visit for today to f/u on anxiety.

## 2019-05-29 NOTE — Progress Notes (Signed)
   TELEPHONE ENCOUNTER   Patient verbally agreed to telephone visit and is aware that copayment and coinsurance may apply. Patient was treated using telemedicine according to accepted telemedicine protocols.  Location of the patient: home Location of provider: [place] of all persons participating in the telemedicine service and role in the encounter: Inda Coke, PA, Bernard Mora (patient), Anselmo Pickler (LPN)  Subjective:   Chief Complaint  Patient presents with  . Anxiety     HPI   Anxiety Patient is here to follow-up to possibly discuss reducing his Zoloft dosage. He is currently prescribed 50 mg daily, however about 3 weeks ago he went ahead and cut his tablets in half and has been taking 25 mg daily.  He is tolerating this well.  He would like a refill today of the 25 mg dosage.  He denies any new concerns with symptoms or trouble concentrating, insomnia or worsening anxiety/depression.  Overall he feels like he is doing really well.  GAD 7 : Generalized Anxiety Score 05/29/2019 12/15/2018 05/26/2018 01/16/2018  Nervous, Anxious, on Edge 0 0 0 0  Control/stop worrying 0 0 0 0  Worry too much - different things 0 0 0 0  Trouble relaxing 0 0 1 0  Restless 0 0 0 0  Easily annoyed or irritable 0 0 0 0  Afraid - awful might happen 0 0 0 0  Total GAD 7 Score 0 0 1 0  Anxiety Difficulty Not difficult at all Not difficult at all Not difficult at all Not difficult at all       Patient Active Problem List   Diagnosis Date Noted  . Hyperhidrosis 08/26/2017  . Anxiety 07/03/2017  . Costochondral separation 10/24/2016   Social History   Tobacco Use  . Smoking status: Never Smoker  . Smokeless tobacco: Never Used  Substance Use Topics  . Alcohol use: Yes    Alcohol/week: 4.0 - 5.0 standard drinks    Types: 4 - 5 Cans of beer per week    Current Outpatient Medications:  .  DRYSOL 20 % external solution, APPLY TO AFFECTED AREA EVERY DAY AT BEDTIME,  Disp: 60 mL, Rfl: 0 .  HYPERCARE 15 % SOLN, APPLY TO AFFECTED AREA EVERY DAY, Disp: 60 mL, Rfl: 1 .  ibuprofen (ADVIL,MOTRIN) 200 MG tablet, Take 200 mg by mouth every 6 (six) hours as needed., Disp: , Rfl:  .  Multiple Vitamin (MULTIVITAMIN) tablet, Take 1 tablet by mouth daily., Disp: , Rfl:  .  Probiotic Product (PROBIOTIC DAILY PO), Take 1 tablet by mouth daily., Disp: , Rfl:  .  sertraline (ZOLOFT) 50 MG tablet, TAKE 1 TABLET BY MOUTH EVERY DAY, Disp: 90 tablet, Rfl: 1 No Known Allergies  Assessment & Plan:   1. Anxiety    Agree with plan to decrease medication to 25 mg daily.  He is not ready to discontinue medication at this time.  I have sent in medication for at least 6 months.  No orders of the defined types were placed in this encounter.  No orders of the defined types were placed in this encounter.   Inda Coke, [place] 05/29/2019  Time spent with the patient: 6 minutes, spent in obtaining information about his symptoms, reviewing his previous labs, evaluations, and treatments, counseling him about his condition (please see the discussed topics above), and developing a plan to further investigate it; he had a number of questions which I addressed.

## 2019-06-07 ENCOUNTER — Other Ambulatory Visit: Payer: Self-pay | Admitting: Physician Assistant

## 2019-08-24 ENCOUNTER — Encounter: Payer: BC Managed Care – PPO | Admitting: Physician Assistant

## 2019-09-28 ENCOUNTER — Encounter: Payer: Self-pay | Admitting: Physician Assistant

## 2019-11-20 ENCOUNTER — Other Ambulatory Visit: Payer: Self-pay | Admitting: Physician Assistant

## 2020-05-26 ENCOUNTER — Other Ambulatory Visit: Payer: Self-pay | Admitting: Physician Assistant

## 2020-05-26 NOTE — Telephone Encounter (Signed)
Last OV 05/29/2019. Ok to refill?

## 2020-10-19 ENCOUNTER — Ambulatory Visit (INDEPENDENT_AMBULATORY_CARE_PROVIDER_SITE_OTHER): Payer: Self-pay | Admitting: Physician Assistant

## 2020-10-19 ENCOUNTER — Other Ambulatory Visit: Payer: Self-pay

## 2020-10-19 ENCOUNTER — Encounter: Payer: Self-pay | Admitting: Physician Assistant

## 2020-10-19 VITALS — BP 170/100 | HR 68 | Temp 97.6°F | Ht 71.5 in | Wt 196.0 lb

## 2020-10-19 DIAGNOSIS — Z0001 Encounter for general adult medical examination with abnormal findings: Secondary | ICD-10-CM

## 2020-10-19 DIAGNOSIS — F419 Anxiety disorder, unspecified: Secondary | ICD-10-CM

## 2020-10-19 DIAGNOSIS — I10 Essential (primary) hypertension: Secondary | ICD-10-CM

## 2020-10-19 DIAGNOSIS — E785 Hyperlipidemia, unspecified: Secondary | ICD-10-CM

## 2020-10-19 LAB — CBC WITH DIFFERENTIAL/PLATELET
Basophils Absolute: 0 10*3/uL (ref 0.0–0.1)
Basophils Relative: 0.7 % (ref 0.0–3.0)
Eosinophils Absolute: 0 10*3/uL (ref 0.0–0.7)
Eosinophils Relative: 0.9 % (ref 0.0–5.0)
HCT: 44.7 % (ref 39.0–52.0)
Hemoglobin: 15.3 g/dL (ref 13.0–17.0)
Lymphocytes Relative: 39.1 % (ref 12.0–46.0)
Lymphs Abs: 1.7 10*3/uL (ref 0.7–4.0)
MCHC: 34.2 g/dL (ref 30.0–36.0)
MCV: 84.3 fl (ref 78.0–100.0)
Monocytes Absolute: 0.3 10*3/uL (ref 0.1–1.0)
Monocytes Relative: 7.5 % (ref 3.0–12.0)
Neutro Abs: 2.3 10*3/uL (ref 1.4–7.7)
Neutrophils Relative %: 51.8 % (ref 43.0–77.0)
Platelets: 220 10*3/uL (ref 150.0–400.0)
RBC: 5.3 Mil/uL (ref 4.22–5.81)
RDW: 13.3 % (ref 11.5–15.5)
WBC: 4.5 10*3/uL (ref 4.0–10.5)

## 2020-10-19 LAB — LIPID PANEL
Cholesterol: 248 mg/dL — ABNORMAL HIGH (ref 0–200)
HDL: 86.8 mg/dL (ref >39.00)
LDL Cholesterol: 144 mg/dL — ABNORMAL HIGH (ref 0–99)
NonHDL: 161.47
Total CHOL/HDL Ratio: 3
Triglycerides: 88 mg/dL (ref 0.0–149.0)
VLDL: 17.6 mg/dL (ref 0.0–40.0)

## 2020-10-19 LAB — COMPREHENSIVE METABOLIC PANEL
ALT: 30 U/L (ref 0–53)
AST: 29 U/L (ref 0–37)
Albumin: 4.5 g/dL (ref 3.5–5.2)
Alkaline Phosphatase: 39 U/L (ref 39–117)
BUN: 11 mg/dL (ref 6–23)
CO2: 31 mEq/L (ref 19–32)
Calcium: 9.1 mg/dL (ref 8.4–10.5)
Chloride: 100 mEq/L (ref 96–112)
Creatinine, Ser: 0.98 mg/dL (ref 0.40–1.50)
GFR: 93.74 mL/min (ref >60.00)
Glucose, Bld: 105 mg/dL — ABNORMAL HIGH (ref 70–99)
Potassium: 3.9 mEq/L (ref 3.5–5.1)
Sodium: 138 mEq/L (ref 135–145)
Total Bilirubin: 0.5 mg/dL (ref 0.2–1.2)
Total Protein: 7.3 g/dL (ref 6.0–8.3)

## 2020-10-19 MED ORDER — AMLODIPINE BESYLATE 5 MG PO TABS: 5.0000 mg | ORAL_TABLET | Freq: Every day | ORAL | 1 refills | Status: DC

## 2020-10-19 MED ORDER — SERTRALINE HCL 50 MG PO TABS: 50.0000 mg | ORAL_TABLET | Freq: Every day | ORAL | 3 refills | Status: DC

## 2020-10-19 NOTE — Patient Instructions (Signed)
It was great to see you!  EKG looks normal. Lets start amlodopine/norvasc 5 mg daily. Check BP 2-3 times a week. If remains >140/90 after one month, let me know and we will increase to 10 mg daily  Zoloft refill sent  Please go to the lab for blood work.   Our office will call you with your results unless you have chosen to receive results via MyChart.  If your blood work is normal we will follow-up each year for physicals and as scheduled for chronic medical problems.  If anything is abnormal we will treat accordingly and get you in for a follow-up.  Take care,  [facility] Maintenance, Male Adopting a healthy lifestyle and getting preventive care are important in promoting health and wellness. Ask your health care provider about:  The right schedule for you to have regular tests and exams.  Things you can do on your own to prevent diseases and keep yourself healthy. What should I know about diet, weight, and exercise? Eat a healthy diet  Eat a diet that includes plenty of vegetables, fruits, low-fat dairy products, and lean protein.  Do not eat a lot of foods that are high in solid fats, added sugars, or sodium.   Maintain a healthy weight Body mass index (BMI) is a measurement that can be used to identify possible weight problems. It estimates body fat based on height and weight. Your health care provider can help determine your BMI and help you achieve or maintain a healthy weight. Get regular exercise Get regular exercise. This is one of the most important things you can do for your health. Most adults should:  Exercise for at least 150 minutes each week. The exercise should increase your heart rate and make you sweat (moderate-intensity exercise).  Do strengthening exercises at least twice a week. This is in addition to the moderate-intensity exercise.  Spend less time sitting. Even light physical activity can be beneficial. Watch cholesterol and blood  lipids Have your blood tested for lipids and cholesterol at 45 years of age, then have this test every 5 years. You may need to have your cholesterol levels checked more often if:  Your lipid or cholesterol levels are high.  You are older than 45 years of age.  You are at high risk for heart disease. What should I know about cancer screening? Many types of cancers can be detected early and may often be prevented. Depending on your health history and family history, you may need to have cancer screening at various ages. This may include screening for:  Colorectal cancer.  Prostate cancer.  Skin cancer.  Lung cancer. What should I know about heart disease, diabetes, and high blood pressure? Blood pressure and heart disease  High blood pressure causes heart disease and increases the risk of stroke. This is more likely to develop in people who have high blood pressure readings, are of African descent, or are overweight.  Talk with your health care provider about your target blood pressure readings.  Have your blood pressure checked: ? Every 3-5 years if you are 25-34 years of age. ? Every year if you are 34 years old or older.  If you are between the ages of 51 and 34 and are a current or former smoker, ask your health care provider if you should have a one-time screening for abdominal aortic aneurysm (AAA). Diabetes Have regular diabetes screenings. This checks your fasting blood sugar level. Have the screening done:  Once  every three years after age 72 if you are at a normal weight and have a low risk for diabetes.  More often and at a younger age if you are overweight or have a high risk for diabetes. What should I know about preventing infection? Hepatitis B If you have a higher risk for hepatitis B, you should be screened for this virus. Talk with your health care provider to find out if you are at risk for hepatitis B infection. Hepatitis C Blood testing is recommended  for:  Everyone born from [birth year] through 1965.  Anyone with known risk factors for hepatitis C. Sexually transmitted infections (STIs)  You should be screened each year for STIs, including gonorrhea and chlamydia, if: ? You are sexually active and are younger than 45 years of age. ? You are older than 45 years of age and your health care provider tells you that you are at risk for this type of infection. ? Your sexual activity has changed since you were last screened, and you are at increased risk for chlamydia or gonorrhea. Ask your health care provider if you are at risk.  Ask your health care provider about whether you are at high risk for HIV. Your health care provider may recommend a prescription medicine to help prevent HIV infection. If you choose to take medicine to prevent HIV, you should first get tested for HIV. You should then be tested every 3 months for as long as you are taking the medicine. Follow these instructions at home: Lifestyle  Do not use any products that contain nicotine or tobacco, such as cigarettes, e-cigarettes, and chewing tobacco. If you need help quitting, ask your health care provider.  Do not use street drugs.  Do not share needles.  Ask your health care provider for help if you need support or information about quitting drugs. Alcohol use  Do not drink alcohol if your health care provider tells you not to drink.  If you drink alcohol: ? Limit how much you have to 0-2 drinks a day. ? Be aware of how much alcohol is in your drink. In the [place] one drink equals one 12 oz bottle of beer (355 mL), one 5 oz glass of wine (148 mL), or one 1 oz glass of hard liquor (44 mL). General instructions  Schedule regular health, dental, and eye exams.  Stay current with your vaccines.  Tell your health care provider if: ? You often feel depressed. ? You have ever been abused or do not feel safe at home. Summary  Adopting a healthy lifestyle and getting  preventive care are important in promoting health and wellness.  Follow your health care provider's instructions about healthy diet, exercising, and getting tested or screened for diseases.  Follow your health care provider's instructions on monitoring your cholesterol and blood pressure. This information is not intended to replace advice given to you by your health care provider. Make sure you discuss any questions you have with your health care provider. Document Revised: 07/30/2018 Document Reviewed: 07/30/2018 Elsevier Patient Education  2021 [employer].

## 2020-10-19 NOTE — Progress Notes (Signed)
I acted as a Education administrator for [employer], PA-C Bernard Pickler, LPN   Subjective:    Bernard Mora is a 45 y.o. male and is here for a comprehensive physical exam.  HPI  Health Maintenance Due  Topic Date Due  . Hepatitis C Screening  Never done    Acute Concerns: Elevated blood pressure reading -- got lightheaded on the way to work about 2-4 weeks ago. Denies syncope.  Started checking blood pressure after that. Pt has been c/o elevated blood pressure for the past 10 days. Has been checking Bp at home and has been averaging 150/90. Has had increased stress over the past several months. Has had some dietary changes and lifestyle changes but he is working on trying to get that improved. Was on BP med briefly ~1 month prior to seeing me. Has had more cardiac awareness -- felt like at times he can feel his heart beating. Denies chest pain or pressure, SOB.  Chronic Issues: Anxiety -- zoloft 50 mg. Doing well overall with this. He was prescribed 25 mg daily but increased on his own recently. Denies SI/HI.  Health Maintenance: Immunizations --UTD, declines Flu & COVID vaccines Colonoscopy --due PSA --N/A Diet -- trying to eat healthy over the past week, has been having to do more fast food due to work changes; keto at home for the most part Caffeine intake -- recently cut down to 2-3 cups in AM Sleep habits -- intermittent issues with work stressors Exercise -- reduced due to weather and job stress Weight -- Weight: 196 lb (88.9 kg)  Weight history Wt Readings from Last 10 Encounters:  10/19/20 196 lb (88.9 kg)  05/29/19 185 lb (83.9 kg)  10/28/18 183 lb (83 kg)  08/12/18 182 lb 3.2 oz (82.6 kg)  05/26/18 185 lb 4 oz (84 kg)  01/16/18 189 lb (85.7 kg)  08/26/17 188 lb 9.6 oz (85.5 kg)  07/03/17 185 lb (83.9 kg)  05/23/17 180 lb (81.6 kg)  10/18/16 183 lb (83 kg)   Body mass index is 26.96 kg/m. Mood -- anxiety at times Tobacco use --  Tobacco Use: Low Risk   . Smoking Tobacco  Use: Never Smoker  . Smokeless Tobacco Use: Never Used    Alcohol use ---  reports current alcohol use of about 4.0 - 5.0 standard drinks of alcohol per week.   Depression screen PHQ 2/9 10/19/2020  Decreased Interest 0  Down, Depressed, Hopeless 0  PHQ - 2 Score 0     Other providers/specialists: Patient Care Team: Bernard Mora, [place] as PCP - General (Physician Assistant)   PMHx, SurgHx, SocialHx, Medications, and Allergies were reviewed in the Visit Navigator and updated as appropriate.   Past Medical History:  Diagnosis Date  . Allergy   . Hyperlipidemia      Past Surgical History:  Procedure Laterality Date  . crack ribs Right 07/30/2016  . FRACTURE SURGERY Left 1993   wrist  . TONSILLECTOMY       Family History  Problem Relation Age of Onset  . Arthritis Mother   . Diverticulitis Mother        did require colostomy  . Cancer Father        don't know the type, possible kidney/lung and mets  . Other Brother        skin lesions  . Diabetes Paternal Grandfather     Social History   Tobacco Use  . Smoking status: Never Smoker  . Smokeless tobacco: Never Used  Substance  Use Topics  . Alcohol use: Yes    Alcohol/week: 4.0 - 5.0 standard drinks    Types: 4 - 5 Cans of beer per week  . Drug use: No    Review of Systems:   Review of Systems  Constitutional: Negative for chills, fever, malaise/fatigue and weight loss.  HENT: Negative for hearing loss, sinus pain and sore throat.   Respiratory: Negative for cough and hemoptysis.   Cardiovascular: Negative for chest pain, palpitations, leg swelling and PND.  Gastrointestinal: Negative for abdominal pain, constipation, diarrhea, heartburn, nausea and vomiting.  Genitourinary: Negative for dysuria, frequency and urgency.  Musculoskeletal: Negative for back pain, myalgias and neck pain.  Skin: Negative for itching and rash.  Neurological: Negative for dizziness, tingling, seizures and headaches.   Endo/Heme/Allergies: Negative for polydipsia.  Psychiatric/Behavioral: Negative for depression. The patient is not nervous/anxious.     Objective:   Vitals:   10/19/20 0904  BP: (!) 170/100  Pulse: 68  Temp: 97.6 F (36.4 C)  SpO2: 98%   Body mass index is 26.96 kg/m.  General Appearance:  Alert, cooperative, no distress, appears stated age  Head:  Normocephalic, without obvious abnormality, atraumatic  Eyes:  PERRL, conjunctiva/corneas clear, EOM's intact, fundi benign, both eyes       Ears:  Normal TM's and external ear canals, both ears  Nose: Nares normal, septum midline, mucosa normal, no drainage    or sinus tenderness  Throat: Lips, mucosa, and tongue normal; teeth and gums normal  Neck: Supple, symmetrical, trachea midline, no adenopathy; thyroid:  No enlargement/tenderness/nodules; no carotit bruit or JVD  Back:   Symmetric, no curvature, ROM normal, no CVA tenderness  Lungs:   Clear to auscultation bilaterally, respirations unlabored  Chest wall:  No tenderness or deformity  Heart:  Regular rate and rhythm, S1 and S2 normal, no murmur, rub   or gallop  Abdomen:   Soft, non-tender, bowel sounds active all four quadrants, no masses, no organomegaly  Extremities: Extremities normal, atraumatic, no cyanosis or edema  Prostate: Not done.   Skin: Skin color, texture, turgor normal, no rashes or lesions  Lymph nodes: Cervical, supraclavicular, and axillary nodes normal  Neurologic: CNII-XII grossly intact. Normal strength, sensation and reflexes throughout     Assessment/Plan:   Bernard Mora was seen today for annual exam.  Diagnoses and all orders for this visit:  Encounter for general adult medical examination with abnormal findings Today patient counseled on age appropriate routine health concerns for screening and prevention, each reviewed and up to date or declined. Immunizations reviewed and up to date or declined. Labs ordered and reviewed. Risk factors for depression  reviewed and negative. Hearing function and visual acuity are intact. ADLs screened and addressed as needed. Functional ability and level of safety reviewed and appropriate. Education, counseling and referrals performed based on assessed risks today. Patient provided with a copy of personalized plan for preventive services.  Primary hypertension EKG tracing is personally reviewed.  EKG notes NSR.  No acute changes.  Update blood work. Continue to work on lifestyle changes. Start 5 mg norvasc. Check BP 2-3 times a week. If BP >140/90 after one month, incresae to 10 mg norvasc daily. -     CBC with Differential/Platelet -     Comprehensive metabolic panel -     EKG 67-HALP  Hyperlipidemia, unspecified hyperlipidemia type Update lipid panel today and provide recommendations accordingly. -     Lipid panel  Anxiety Well controlled.  Continue zoloft 50 mg daily.  Follow-up in 1 year, sooner if concerns.   Other orders -     sertraline (ZOLOFT) 50 MG tablet; Take 1 tablet (50 mg total) by mouth at bedtime. -     amLODipine (NORVASC) 5 MG tablet; Take 1 tablet (5 mg total) by mouth daily.    Well Adult Exam: Labs ordered: Yes. Patient counseling was done. See below for items discussed. Discussed the patient's BMI.  The BMI is in the acceptable range Follow up 6 months.  Patient Counseling: [x]   Nutrition: Stressed importance of moderation in sodium/caffeine intake, saturated fat and cholesterol, caloric balance, sufficient intake of fresh fruits, vegetables, and fiber.  [x]   Stressed the importance of regular exercise.   []   Substance Abuse: Discussed cessation/primary prevention of tobacco, alcohol, or other drug use; driving or other dangerous activities under the influence; availability of treatment for abuse.   [x]   Injury prevention: Discussed safety belts, safety helmets, smoke detector, smoking near bedding or upholstery.   []   Sexuality: Discussed sexually transmitted diseases,  partner selection, use of condoms, avoidance of unintended pregnancy  and contraceptive alternatives.   [x]   Dental health: Discussed importance of regular tooth brushing, flossing, and dental visits.  [x]   Health maintenance and immunizations reviewed. Please refer to Health maintenance section.    CMA or LPN served as scribe during this visit. History, Physical, and Plan performed by medical provider. The above documentation has been reviewed and is accurate and complete.   Bernard Coke, PA-C [place]

## 2021-01-23 ENCOUNTER — Ambulatory Visit: Payer: Medicaid Other | Admitting: Family Medicine

## 2021-02-10 ENCOUNTER — Ambulatory Visit: Payer: Medicaid Other | Admitting: Family Medicine

## 2021-02-10 NOTE — Progress Notes (Signed)
Patient no showed visit today.

## 2021-02-15 ENCOUNTER — Encounter: Payer: Self-pay | Admitting: Physician Assistant

## 2021-04-18 ENCOUNTER — Telehealth: Payer: Self-pay

## 2021-04-18 ENCOUNTER — Encounter (HOSPITAL_BASED_OUTPATIENT_CLINIC_OR_DEPARTMENT_OTHER): Payer: Self-pay

## 2021-04-18 ENCOUNTER — Emergency Department (HOSPITAL_BASED_OUTPATIENT_CLINIC_OR_DEPARTMENT_OTHER)
Admission: EM | Admit: 2021-04-18 | Discharge: 2021-04-18 | Disposition: A | Discharge status: Home / Self Care | Payer: Medicaid Other | Attending: Emergency Medicine | Admitting: Emergency Medicine

## 2021-04-18 ENCOUNTER — Other Ambulatory Visit: Payer: Self-pay

## 2021-04-18 DIAGNOSIS — H81399 Other peripheral vertigo, unspecified ear: Secondary | ICD-10-CM | POA: Insufficient documentation

## 2021-04-18 DIAGNOSIS — I10 Essential (primary) hypertension: Secondary | ICD-10-CM | POA: Insufficient documentation

## 2021-04-18 DIAGNOSIS — Z20822 Contact with and (suspected) exposure to covid-19: Secondary | ICD-10-CM | POA: Insufficient documentation

## 2021-04-18 HISTORY — DX: Essential (primary) hypertension: I10

## 2021-04-18 LAB — CBC
HCT: 46.3 % (ref 39.0–52.0)
Hemoglobin: 15.8 g/dL (ref 13.0–17.0)
MCH: 28.5 pg (ref 26.0–34.0)
MCHC: 34.1 g/dL (ref 30.0–36.0)
MCV: 83.4 fL (ref 80.0–100.0)
Platelets: 250 10*3/uL (ref 150–400)
RBC: 5.55 MIL/uL (ref 4.22–5.81)
RDW: 13.1 % (ref 11.5–15.5)
WBC: 4.9 10*3/uL (ref 4.0–10.5)
nRBC: 0 % (ref 0.0–0.2)

## 2021-04-18 LAB — BASIC METABOLIC PANEL
Anion gap: 10 (ref 5–15)
BUN: 10 mg/dL (ref 6–20)
CO2: 28 mmol/L (ref 22–32)
Calcium: 9.3 mg/dL (ref 8.9–10.3)
Chloride: 100 mmol/L (ref 98–111)
Creatinine, Ser: 1.03 mg/dL (ref 0.61–1.24)
GFR, Estimated: >60 mL/min (ref >60)
Glucose, Bld: 131 mg/dL — ABNORMAL HIGH (ref 70–99)
Potassium: 3.7 mmol/L (ref 3.5–5.1)
Sodium: 138 mmol/L (ref 135–145)

## 2021-04-18 LAB — RESP PANEL BY RT-PCR (FLU A&B, COVID) ARPGX2
Influenza A by PCR: NEGATIVE
Influenza B by PCR: NEGATIVE
SARS Coronavirus 2 by RT PCR: NEGATIVE

## 2021-04-18 MED ORDER — MECLIZINE HCL 25 MG PO TABS: 25.0000 mg | ORAL_TABLET | Freq: Three times a day (TID) | ORAL | 0 refills | Status: DC | PRN

## 2021-04-18 MED ORDER — SODIUM CHLORIDE 0.9 % IV BOLUS
500.0000 mL | Freq: Once | INTRAVENOUS | Status: AC
  Administered 2021-04-18: 500 mL via INTRAVENOUS

## 2021-04-18 MED ORDER — DIAZEPAM 5 MG/ML IJ SOLN
5.0000 mg | Freq: Once | INTRAMUSCULAR | Status: AC
  Administered 2021-04-18: 5 mg via INTRAVENOUS
  Filled 2021-04-18: qty 2

## 2021-04-18 NOTE — Discharge Instructions (Addendum)
We suspect that your symptoms are related to peripheral vertigo.  Take the medications that are prescribed.  Perform the exercises that have been printed.  See your primary care doctor in 1 week. Return to the ER if your symptoms get worse or become constant, and starts impacting your gait/balance.

## 2021-04-18 NOTE — Telephone Encounter (Signed)
See note

## 2021-04-18 NOTE — Telephone Encounter (Signed)
Nurse Assessment Nurse: Jimmye Norman, RN, Armen Pickup Date/Time Eilene Ghazi Time): 04/18/2021 7:28:58 AM Confirm and document reason for call. If symptomatic, describe symptoms. ---Caller states he has been off his blood pressure medications for about 60 days. 150/100, getting lightheaded. Severe dizziness and room spinning yesterday. Does the patient have any new or worsening symptoms? ---Yes Will a triage be completed? ---Yes Related visit to physician within the last 2 weeks? ---No Does the PT have any chronic conditions? (i.e. diabetes, asthma, this includes High risk factors for pregnancy, etc.) ---Yes List chronic conditions. ---HTN Is this a behavioral health or substance abuse call? ---No Guidelines Guideline Title Affirmed Question Affirmed Notes Nurse Date/Time (Eastern Time) Blood Pressure - High [id number] Systolic BP >= 0000000 OR Diastolic >= [id number] AND [id number] cardiac or neurologic symptoms (e.g., Jimmye Norman, RN, Armen Pickup 04/18/2021 7:32:37 AM PLEASE NOTE: All timestamps contained within this report are represented as [place] Standard Time. CONFIDENTIALTY NOTICE: This fax transmission is intended only for the addressee. It contains information that is legally privileged, confidential or otherwise protected from use or disclosure. If you are not the intended recipient, you are strictly prohibited from reviewing, disclosing, copying using or disseminating any of this information or taking any action in reliance on or regarding this information. If you have received this fax in error, please notify us immediately by telephone so that we can arrange for its return to [place]. Phone: [phone], Toll-Free: [phone], Fax: [phone] Page: 2 of 2 Call Id: [id number] Guidelines Guideline Title Affirmed Question Affirmed Notes Nurse Date/Time Eilene Ghazi Time) chest pain, difficulty breathing, unsteady gait, blurred vision) Disp. Time Eilene Ghazi Time) Disposition Final User 04/18/2021 7:34:11 AM Go  to ED Now Yes Jimmye Norman, RN, Armen Pickup Caller Disagree/Comply Comply Caller Understands Yes PreDisposition Call Doctor Care Advice Given Per Guideline GO TO ED NOW: * You need to be seen in the Emergency Department. * Go to the ED at ___________ [place] now. Drive carefully. NOTE TO TRIAGER - DRIVING: * Another adult should drive. * Patient should not delay going to the emergency department. * If immediate transportation is not available via car, rideshare (e.g., Lyft, Uber), or taxi, then the patient should be instructed to call [id number]. CALL EMS 911 IF: * Passes out or faints * Becomes confused * You become worse CARE ADVICE given per High Blood Pressure (Adult) guideline. Referrals GO TO FACILITY UNDECIDED

## 2021-04-18 NOTE — ED Provider Notes (Signed)
[facility] DE[facility] Provider Note   CSN: [id number] Arrival date & time: 04/18/21  [id number]     History Chief Complaint  Patient presents with   Dizziness    Bernard Mora is a 45 y.o. male.  HPI     45 year old male comes in with chief complaint of dizziness.  Patient has history of hypertension, hyperlipidemia and mild anxiety.  He reports yesterday he had an episode of dizziness that started while he was pumping gas.  Initially the symptoms were intermittent lasting just few seconds, but once he got home he had an episode of vertigo that lasted few minutes.  The symptoms were severe and worsening, to the point where he had to sit down on the floor.  Besides that singular episode, he has not had any vertiginous symptoms but he has had " low motivation"/malaise type feeling along with persistent dizziness.  Dizziness is described as " walking on cloud".  Patient denies any slurred speech, focal numbness, weakness, tingling, vision change.  Past Medical History:  Diagnosis Date   Allergy    Hyperlipidemia    Hypertension     Patient Active Problem List   Diagnosis Date Noted   Hyperhidrosis 08/26/2017   Anxiety 07/03/2017   Costochondral separation 10/24/2016    Past Surgical History:  Procedure Laterality Date   crack ribs Right 07/30/2016   FRACTURE SURGERY Left 1993   wrist   TONSILLECTOMY         Family History  Problem Relation Age of Onset   Arthritis Mother    Diverticulitis Mother        did require colostomy   Cancer Father        don't know the type, possible kidney/lung and mets   Other Brother        skin lesions   Diabetes Paternal Grandfather     Social History   Tobacco Use   Smoking status: Never   Smokeless tobacco: Never  Vaping Use   Vaping Use: Never used  Substance Use Topics   Alcohol use: Yes    Alcohol/week: 4.0 - 5.0 standard drinks    Types: 4 - 5 Cans of beer per week   Drug use: No    Home  Medications Prior to Admission medications   Medication Sig Start Date End Date Taking? Authorizing Provider  calcium citrate-vitamin D (CITRACAL+D) 315-200 MG-UNIT tablet Take 1 tablet by mouth 2 (two) times daily.   Yes [provider]  meclizine (ANTIVERT) 25 MG tablet Take 1 tablet (25 mg total) by mouth 3 (three) times daily as needed for dizziness. 04/18/21  Yes Varney Biles, MD  OVER THE COUNTER MEDICATION Take 2 capsules by mouth daily in the afternoon. SNAP, Heart and Blood pressure supplement   Yes [provider]  Probiotic Product (PROBIOTIC DAILY PO) Take 1 tablet by mouth daily.   Yes [provider]  sertraline (ZOLOFT) 50 MG tablet Take 1 tablet (50 mg total) by mouth at bedtime. 10/19/20  Yes Inda Coke, PA  vitamin B-12 (CYANOCOBALAMIN) 1000 MCG tablet Take 1,000 mcg by mouth daily.   Yes [provider]  ibuprofen (ADVIL,MOTRIN) 200 MG tablet Take 200 mg by mouth every 6 (six) hours as needed.    [provider]  Multiple Vitamin (MULTIVITAMIN) tablet Take 1 tablet by mouth daily.    [provider]    Allergies    Patient has no known allergies.  Review of Systems   Review of Systems  Constitutional:  Positive for activity change.  Respiratory:  Negative for shortness of breath.   Cardiovascular:  Negative for chest pain.  Gastrointestinal:  Negative for vomiting.  Neurological:  Positive for dizziness.  All other systems reviewed and are negative.  Physical Exam Updated Vital Signs BP 132/87 (BP Location: Right Arm)   Pulse 65   Temp 99.1 F (37.3 C) (Oral)   Resp 13   Ht '6\' 1"'$  (1.854 m)   Wt 83.9 kg   SpO2 100%   BMI 24.41 kg/m   Physical Exam Vitals and nursing note reviewed.  Constitutional:      Appearance: He is well-developed.  HENT:     Head: Atraumatic.  Eyes:     Extraocular Movements: Extraocular movements intact.     Pupils: Pupils are equal, round, and reactive to light.   Cardiovascular:     Rate and Rhythm: Normal rate.  Pulmonary:     Effort: Pulmonary effort is normal.  Musculoskeletal:     Cervical back: Neck supple.  Skin:    General: Skin is warm.  Neurological:     Mental Status: He is alert and oriented to person, place, and time.     Cranial Nerves: No cranial nerve deficit.     Sensory: No sensory deficit.     Motor: No weakness.     Coordination: Coordination normal.     Comments: Hints exam did not reveal any abnormalities (no nystagmus, skew, no correction with head impulse).    ED Results / Procedures / Treatments   Labs (all labs ordered are listed, but only abnormal results are displayed) Labs Reviewed  BASIC METABOLIC PANEL - Abnormal; Notable for the following components:      Result Value   Glucose, Bld 131 (*)    All other components within normal limits  RESP PANEL BY RT-PCR (FLU A&B, COVID) ARPGX2  CBC  URINALYSIS, ROUTINE W REFLEX MICROSCOPIC    EKG EKG Interpretation  Date/Time:  Tuesday April 18 2021 08:35:58 EDT Ventricular Rate:  79 PR Interval:  160 QRS Duration: 102 QT Interval:  400 QTC Calculation: 459 R Axis:   55 Text Interpretation: Sinus rhythm No acute changes No significant change since last tracing Confirmed by Varney Biles [id number]) on 04/18/2021 8:51:05 AM  Radiology No results found.  Procedures Procedures   Medications Ordered in ED Medications  diazepam (VALIUM) injection 5 mg (5 mg Intravenous Given 04/18/21 0940)  sodium chloride 0.9 % bolus 500 mL (0 mLs Intravenous Stopped 04/18/21 1117)    ED Course  I have reviewed the triage vital signs and the nursing notes.  Pertinent labs & imaging results that were available during my care of the patient were reviewed by me and considered in my medical decision making (see chart for details).    MDM Rules/Calculators/A&P                           45 year old comes in a chief complaint of dizziness.  Patient had an episode of  vertigo, otherwise he has been feeling dizzy/sluggish when he walks.  No other focal neurodeficits.  Neuro exam, including hints exam is reassuring.  Patient ambulated, no ataxia.  Orthostatic negative.  Patient has no risk factors on social history side.  No trauma.  No meningismus.  Clinically, it appears that patient likely is having vestibulocochlear symptoms.  Other possibility considered include orthostasis, electrolyte abnormality, medication side effect, COVID-19.  Labs ordered.  No CT  needed.  11:31 AM Patient reassessed.  He feels better after receiving Valium and fluid.  His blood work is completely reassuring.  I ambulated the patient, he feels better now than he did prior to getting the intervention. For now, we have advised him to take meclizine and follow-up with his PCP.  Epley maneuver information provided. Strict ER return precautions have been discussed, and patient is agreeing with the plan and is comfortable with the workup done and the recommendations from the ER.    Final Clinical Impression(s) / ED Diagnoses Final diagnoses:  Peripheral vertigo, unspecified laterality    Rx / DC Orders ED Discharge Orders          Ordered    meclizine (ANTIVERT) 25 MG tablet  3 times daily PRN        04/18/21 1112             Varney Biles, MD 04/18/21 1131

## 2021-04-18 NOTE — ED Triage Notes (Signed)
Pt arrives POV, reports multiple episodes of dizziness beginning yesterday.  Denies falls or LOC.

## 2021-06-13 ENCOUNTER — Telehealth (INDEPENDENT_AMBULATORY_CARE_PROVIDER_SITE_OTHER): Payer: Self-pay | Admitting: Family Medicine

## 2021-06-13 ENCOUNTER — Encounter: Payer: Self-pay | Admitting: Family Medicine

## 2021-06-13 VITALS — Temp 98.0°F

## 2021-06-13 DIAGNOSIS — R42 Dizziness and giddiness: Secondary | ICD-10-CM

## 2021-06-13 DIAGNOSIS — R197 Diarrhea, unspecified: Secondary | ICD-10-CM

## 2021-06-13 DIAGNOSIS — R509 Fever, unspecified: Secondary | ICD-10-CM

## 2021-06-13 DIAGNOSIS — R6883 Chills (without fever): Secondary | ICD-10-CM

## 2021-06-13 DIAGNOSIS — R059 Cough, unspecified: Secondary | ICD-10-CM

## 2021-06-13 DIAGNOSIS — R6889 Other general symptoms and signs: Secondary | ICD-10-CM

## 2021-06-13 DIAGNOSIS — R0981 Nasal congestion: Secondary | ICD-10-CM

## 2021-06-13 MED ORDER — BENZONATATE 100 MG PO CAPS: ORAL_CAPSULE | ORAL | 0 refills | Status: DC

## 2021-06-13 NOTE — Progress Notes (Signed)
Virtual Visit via Video Note  I connected with Oswin  on 06/13/21 at  1:20 PM EDT by a video enabled telemedicine application and verified that I am speaking with the correct person using two identifiers.  Location patient: home, [place] Location provider:work or home office Persons participating in the virtual visit: patient, provider  I discussed the limitations of evaluation and management by telemedicine and the availability of in person appointments. The patient expressed understanding and agreed to proceed.   HPI:  Acute telemedicine visit for  Sinus issues: -Onset: 5 days ago -neg home covid test 2 days ago -Symptoms include:nasal sinus congestion, cough in the morning, some laryngitis, ears feel full, low grade temp around 99.5, chills, body aches, sore throat, diarrhea, mild dizziness if moves quickly at times -Was feeling better yesterday, diarrhea has resolved today -several folks at work sick with the flu recently -Denies:fever today, CP, SOB, vomiting, inability to eat/drink/get out of bed -Has tried: sinus max and ibuprofen -Pertinent past medical history:see below -Pertinent medication allergies: No Known Allergies -COVID-19 vaccine status: Immunization History  Administered Date(s) Administered   Td 11/19/2014  No covid vaccines and never had flu shot   ROS: See pertinent positives and negatives per HPI.  Past Medical History:  Diagnosis Date   Allergy    Hyperlipidemia    Hypertension     Past Surgical History:  Procedure Laterality Date   crack ribs Right 07/30/2016   FRACTURE SURGERY Left 1993   wrist   TONSILLECTOMY       Current Outpatient Medications:    benzonatate (TESSALON PERLES) 100 MG capsule, 1-2 capsule twice daily as needed for cough., Disp: 20 capsule, Rfl: 0   calcium citrate-vitamin D (CITRACAL+D) 315-200 MG-UNIT tablet, Take 1 tablet by mouth 2 (two) times daily., Disp: , Rfl:    ibuprofen (ADVIL,MOTRIN) 200 MG tablet, Take 200 mg by  mouth every 6 (six) hours as needed., Disp: , Rfl:    Multiple Vitamin (MULTIVITAMIN) tablet, Take 1 tablet by mouth daily., Disp: , Rfl:    OVER THE COUNTER MEDICATION, Take 2 capsules by mouth daily in the afternoon. SNAP, Heart and Blood pressure supplement, Disp: , Rfl:    Probiotic Product (PROBIOTIC DAILY PO), Take 1 tablet by mouth daily., Disp: , Rfl:    sertraline (ZOLOFT) 50 MG tablet, Take 1 tablet (50 mg total) by mouth at bedtime. (Patient taking differently: Take 25 mg by mouth at bedtime.), Disp: 90 tablet, Rfl: 3  EXAM:  VITALS per patient if applicable:  GENERAL: alert, oriented, appears well and in no acute distress  HEENT: atraumatic, conjunttiva clear, no obvious abnormalities on inspection of external nose and ears  NECK: normal movements of the head and neck  LUNGS: on inspection no signs of respiratory distress, breathing rate appears normal, no obvious gross SOB, gasping or wheezing  CV: no obvious cyanosis  MS: moves all visible extremities without noticeable abnormality  PSYCH/NEURO: pleasant and cooperative, no obvious depression or anxiety, speech and thought processing grossly intact  ASSESSMENT AND PLAN:  Discussed the following assessment and plan:  Flu-like symptoms  -we discussed possible serious and likely etiologies, options for evaluation and workup, limitations of telemedicine visit vs in person visit, treatment, treatment risks and precautions. Pt is agreeable to treatment via telemedicine at this moment.  Query influenza, COVID-19 with false negative testing, versus other.  Opted to treat symptomatically at this point given the timing with Tessalon for cough, nasal saline, short course nasal decongestant, Imodium if  needed and other symptomatic care measures per patient instructions. Work/School slipped offered: provided in patient instructions  Advised to seek prompt in person care if worsening, new symptoms arise, or if is not improving with  treatment. Discussed options for inperson care if PCP office not available. Did let this patient know that I only do telemedicine on Tuesdays and Thursdays for [place]. Advised to schedule follow up visit with PCP or [facility] if any further questions or concerns to avoid delays in care.   I discussed the assessment and treatment plan with the patient. The patient was provided an opportunity to ask questions and all were answered. The patient agreed with the plan and demonstrated an understanding of the instructions.     Lucretia Kern, DO

## 2021-06-13 NOTE — Patient Instructions (Addendum)
   ---------------------------------------------------------------------------------------------------------------------------      WORK SLIP:  Patient Bernard Mora,  Feb 02, 1976, was seen for a medical visit today, 06/13/21 . Please excuse from work for a COVID/flu like illness. If Covid19 testing is positive advise 10 days minimum from the onset of symptoms (06/08/21) PLUS 1 day of no fever and improved symptoms. Will defer to employer for a sooner return to work if patient has 2 negative covid tests 48 hours apart and is feeling better, or if symptoms have resolved, it is greater than 5 days since the positive test and the patient can wear a high-quality, tight fitting mask such as N95 or KN95 at all times for an additional 5 days. Would also suggest COVID19 antigen testing is negative prior to early return from a Covid illness.  Sincerely: E-signature: Dr. Colin Benton, DO [place] Ph: [phone]   ------------------------------------------------------------------------------------------------------------------------------   HOME CARE TIPS:  -COVID19 testing information: [email or web address]  Most pharmacies also offer testing and home test kits.   -I sent the medication(s) we discussed to your pharmacy: Meds ordered this encounter  Medications   benzonatate (TESSALON PERLES) 100 MG capsule    Sig: 1-2 capsule twice daily as needed for cough.    Dispense:  20 capsule    Refill:  0    -imodium if needed per instructions for diarrhea  -can use tylenol or aleve if needed for fevers, aches and pains per instructions  -can use nasal saline a few times per day if you have nasal congestion; sometimes  a short course of Afrin nasal spray for 3 days can help with symptoms as well  -stay hydrated, drink plenty of fluids and eat small healthy meals - avoid dairy  -can take 1000 IU (51mcg) Vit D3 and 100-500 mg of Vit C daily per  instructions  -If the Covid test is positive, check out the [facility] website for more information on home care, transmission and treatment for COVID19  -follow up with your doctor in 2-3 days unless improving and feeling better  -stay home while sick, except to seek medical care. If you have COVID19, ideally it would be best to stay home for a full 10 days since the onset of symptoms PLUS one day of no fever and feeling better. Wear a good mask that fits snugly (such as N95 or KN95) if around others to reduce the risk of transmission.  It was nice to meet you today, and I really hope you are feeling better soon. I help [place] out with telemedicine visits on Tuesdays and Thursdays and am available for visits on those days. If you have any concerns or questions following this visit please schedule a follow up visit with your Primary Care doctor or seek care at a local urgent care clinic to avoid delays in care.    Seek in person care or schedule a follow up video visit promptly if your symptoms worsen, new concerns arise or you are not improving with treatment. Call 911 and/or seek emergency care if your symptoms are severe or life threatening.

## 2021-08-09 ENCOUNTER — Ambulatory Visit: Payer: Managed Care, Other (non HMO) | Admitting: Physician Assistant

## 2021-08-09 ENCOUNTER — Encounter: Payer: Self-pay | Admitting: Physician Assistant

## 2021-08-09 VITALS — BP 166/98 | HR 80 | Temp 98.2°F | Ht 73.0 in | Wt 198.2 lb

## 2021-08-09 DIAGNOSIS — I10 Essential (primary) hypertension: Secondary | ICD-10-CM | POA: Diagnosis not present

## 2021-08-09 DIAGNOSIS — Z1211 Encounter for screening for malignant neoplasm of colon: Secondary | ICD-10-CM

## 2021-08-09 DIAGNOSIS — R1033 Periumbilical pain: Secondary | ICD-10-CM | POA: Diagnosis not present

## 2021-08-09 DIAGNOSIS — F419 Anxiety disorder, unspecified: Secondary | ICD-10-CM | POA: Diagnosis not present

## 2021-08-09 NOTE — Patient Instructions (Signed)
It was great to see you!  Check blood pressure 1-2 days per week If consistently >140/90, please let us know and we will restart your amlodopine 5 mg  Trial miralax 1 capful daily x 1 week If no further improvement after 1 week, or any worsening, please let me know  I am going to put in a referral to [facility] for your colonoscopy Here is the number if you do not hear from them in a few weeks: [phone]  Take care,  Inda Coke PA-C

## 2021-08-09 NOTE — Progress Notes (Signed)
Bernard Mora is a 45 y.o. male here for a new problem.  History of Present Illness:   Chief Complaint  Patient presents with   colonscopy     Pt wants to ask if he can have the colonoscopy now instead of waiting till 45 yrs old   Abdominal Pain    Pt c/o abdominal pain in the mid abdomen area, feels it may be sore abs form lifting but pain radiates in ribs as well. Feeling somewhat crampy as well w/ abnormal bowel movements.    HPI   HTN Currently not taking amlodipine 5 mg. At home blood pressure readings are: not checked. Patient denies chest pain, SOB, blurred vision, dizziness, unusual headaches, lower leg swelling. Patient is non-compliant with medication. Denies excessive caffeine intake, stimulant usage, excessive alcohol intake, or increase in salt consumption.  BP Readings from Last 3 Encounters:  08/09/21 (!) 166/98  04/18/21 132/87  10/19/20 (!) 170/100     Abdominal Pain One week ago was exercising doing his cardio. He had a lower back spasm when lifting an outdoor fire pit. The next day, he developed some mid-abdominal pain/cramping. Since that time he has had some ongoing symptoms. Pain is centered around his lower abdomen under his navel. Does have relief of symptoms if he has a BM. Sx today are better than they have been but he had 3 BM's today. Having increased gas. He did take some miralax for 3 days in a row and had some improvement with that. He avoids a lot of foods at baseline due to IBS -- include dairy and nuts. Pain is 1-2/10.  Denies: rectal bleeding, severe abdominal pain, vomiting, heartburn, unintentional weight change, hematuria, difficulty passing urine, excessive ETOH intake  He is also interested in screening colonoscopy.    Anxiety He decreased his zoloft to 25 mg daily because he thought that his zoloft 50 mg was causing drowsiness. This change did help his symptoms. Feels well controlled. Denies SI/HI.    Past Medical History:  Diagnosis Date    Allergy    Hyperlipidemia    Hypertension      Social History   Tobacco Use   Smoking status: Never   Smokeless tobacco: Never  Vaping Use   Vaping Use: Never used  Substance Use Topics   Alcohol use: Yes    Alcohol/week: 4.0 - 5.0 standard drinks    Types: 4 - 5 Cans of beer per week   Drug use: No    Past Surgical History:  Procedure Laterality Date   crack ribs Right 07/30/2016   FRACTURE SURGERY Left 1993   wrist   TONSILLECTOMY      Family History  Problem Relation Age of Onset   Arthritis Mother    Diverticulitis Mother        did require colostomy   Cancer Father        don't know the type, possible kidney/lung and mets   Other Brother        skin lesions   Diabetes Paternal Grandfather     No Known Allergies  Current Medications:   Current Outpatient Medications:    calcium citrate-vitamin D (CITRACAL+D) 315-200 MG-UNIT tablet, Take 1 tablet by mouth 2 (two) times daily., Disp: , Rfl:    ibuprofen (ADVIL,MOTRIN) 200 MG tablet, Take 200 mg by mouth every 6 (six) hours as needed., Disp: , Rfl:    Multiple Vitamin (MULTIVITAMIN) tablet, Take 1 tablet by mouth daily., Disp: , Rfl:    Probiotic  Product (PROBIOTIC DAILY PO), Take 1 tablet by mouth daily., Disp: , Rfl:    sertraline (ZOLOFT) 50 MG tablet, Take 1 tablet (50 mg total) by mouth at bedtime. (Patient taking differently: Take 25 mg by mouth at bedtime.), Disp: 90 tablet, Rfl: 3   benzonatate (TESSALON PERLES) 100 MG capsule, 1-2 capsule twice daily as needed for cough. (Patient not taking: Reported on 08/09/2021), Disp: 20 capsule, Rfl: 0   OVER THE COUNTER MEDICATION, Take 2 capsules by mouth daily in the afternoon. SNAP, Heart and Blood pressure supplement (Patient not taking: Reported on 08/09/2021), Disp: , Rfl:    Review of Systems:   ROS Negative unless otherwise specified per HPI.  Vitals:   Vitals:   08/09/21 1255  Height: 6\' 1"  (1.854 m)     Body mass index is 24.41  kg/m.  Physical Exam:   Physical Exam Vitals and nursing note reviewed.  Constitutional:      General: He is not in acute distress.    Appearance: He is well-developed. He is not ill-appearing or toxic-appearing.  Cardiovascular:     Rate and Rhythm: Normal rate and regular rhythm.     Pulses: Normal pulses.     Heart sounds: Normal heart sounds, S1 normal and S2 normal.  Pulmonary:     Effort: Pulmonary effort is normal.     Breath sounds: Normal breath sounds.  Abdominal:     General: Abdomen is flat. Bowel sounds are normal.     Palpations: Abdomen is soft.     Tenderness: There is abdominal tenderness. There is no right CVA tenderness, left CVA tenderness, guarding or rebound. Negative signs include Murphy's sign and McBurney's sign.  Skin:    General: Skin is warm and dry.  Neurological:     Mental Status: He is alert.     GCS: GCS eye subscore is 4. GCS verbal subscore is 5. GCS motor subscore is 6.  Psychiatric:        Speech: Speech normal.        Behavior: Behavior normal. Behavior is cooperative.    Assessment and Plan:   Periumbilical abdominal pain No red flags  on exam Overall asymptomatic at this time Suspect possible IBS and/or constipation Will trial daily miralax (1 capful) x 1 week Constipation h/o given  If no further improvement in 1 week or any worsening will perform further work up (blood work, imaging, Social research officer, government) Patient agreeable to plan  Special screening for malignant neoplasms, colon Colonoscopy ordered  Primary hypertension Uncontrolled He is reluctant to restart medications Advised as follows: Check blood pressure 1-2 days per week If consistently >140/90, please let us know and we will restart your amlodopine 5 mg Follow-up with [place] in 3-6 months, sooner if concerns  Anxiety Well controlled Continue zoloft 25 mg daily Follow-up with [place] in 6 months, sooner if concerns  Inda Coke, PA-C

## 2021-08-24 ENCOUNTER — Telehealth: Payer: Self-pay | Admitting: Physician Assistant

## 2021-08-24 NOTE — Telephone Encounter (Signed)
Pt called and said that at previous visit on 08/09/21 that [facility] told him to monitor his BP and he has, Today it was 145/88 was the lowest and the highest was 155/99, He said it was the about the same a week ago when he checked as well. The last 3 days he has done about 45 minutes of cardio and he was light headed after and his vision was a little blurry. He wanted to know what he should do. Callback [phone]

## 2021-08-25 NOTE — Telephone Encounter (Signed)
Patient scheduled 1/9  Patient Name: Bernard Mora Gender: Male DOB: May 01, 1976 Age: 46 Y 18 M 30 D Return Phone Number: [phone] (Primary), [phone] (Secondary) Address: City/ State/ Zip: [place]  [address] Client [facility] [place] [facility] [place] Day Provider Morene Rankins, [place] [place] Contact Type Call Who Is Calling Patient / Member / Family / Caregiver Call Type Triage / Clinical Relationship To Patient Self Return Phone Number [phone] (Primary) Chief Complaint Dizziness Reason for Call Symptomatic / Request for [place] states is with the Answering Service - Pt is experiencing high blood pressure, consistently in the 140s and getting lightheaded, not sure what to do? Yesterday Bp was 145/98. Translation No Nurse Assessment Nurse: Cira Servant, RN, Suanne Marker Date/Time (Eastern Time): 08/25/2021 1:10:04 PM Confirm and document reason for call. If symptomatic, describe symptoms. ---- Pt is experiencing high blood pressure, consistently in the 140s and getting lightheaded, not sure what to do? Yesterday Bp was 145/98. Does the patient have any new or worsening symptoms? ---Yes Will a triage be completed? ---Yes Related visit to physician within the last 2 weeks? ---No Does the PT have any chronic conditions? (i.e. diabetes, asthma, this includes High risk factors for pregnancy, etc.) ---Yes List chronic conditions. ---vertigo Is this a behavioral health or substance abuse call? ---No Guidelines Guideline Title Affirmed Question Affirmed Notes Nurse Date/Time (Eastern Time) Dizziness - Lightheadedness [1] MODERATE dizziness (e.g., interferes with normal activities) AND [2] has Cira Servant, Therapist, sports, Suanne Marker 08/25/2021 1:12:03 PM  Guidelines Guideline Title Affirmed Question Affirmed Notes Nurse Date/Time ([place] Time) been evaluated by physician for this Disp. Time  Eilene Ghazi Time) Disposition Final User 08/25/2021 1:24:04 PM SEE PCP WITHIN 3 DAYS Yes Cira Servant, RN, Suanne Marker Caller Disagree/Comply Comply Caller Understands Yes PreDisposition Call Doctor Care Advice Given Per Guideline SEE PCP WITHIN 3 DAYS: * You need to be seen within 2 or 3 days. * PCP VISIT: Call your doctor (or NP/PA) during regular office hours and make an appointment. A clinic or urgent care center are good places to go for care if your doctor's office is closed or you can't get an appointment. NOTE: If office will be open tomorrow, tell caller to call then, not in 3 days. DRINK FLUIDS: * Drink several glasses of fruit juice, other clear fluids or water. * This will improve hydration and blood glucose. LIE DOWN AND REST: * Lie down with feet elevated for 1 hour. * This will improve circulation and increase blood flow to the brain. CALL BACK IF: * You become worse * Passes out (faints) CARE ADVICE given per Dizziness (Adult) guideline. Referrals REFERRED TO PCP OFFICE

## 2021-08-25 NOTE — Telephone Encounter (Signed)
Spoke to pt asked him if he has rechecked his blood pressure today? Pt said but has been going on for couple weeks blood pressure elevated. He said not sure if dizziness is related to blood pressure or vertigo which he has hx of. Asked pt if he has been having any Chest pain or SOB. Pt denies symptoms. Told pt if is blood pressure gets worse or if he develops any other symptoms need to go to Urgent care to be evaluated otherwise will see you on Monday. Pt verbalized understanding.

## 2021-08-28 ENCOUNTER — Ambulatory Visit: Payer: Managed Care, Other (non HMO) | Admitting: Physician Assistant

## 2021-08-28 ENCOUNTER — Encounter: Payer: Self-pay | Admitting: *Deleted

## 2021-08-28 ENCOUNTER — Other Ambulatory Visit: Payer: Self-pay

## 2021-08-28 ENCOUNTER — Encounter: Payer: Self-pay | Admitting: Physician Assistant

## 2021-08-28 ENCOUNTER — Ambulatory Visit (INDEPENDENT_AMBULATORY_CARE_PROVIDER_SITE_OTHER): Payer: Managed Care, Other (non HMO)

## 2021-08-28 VITALS — BP 144/100 | HR 77 | Temp 98.4°F | Ht 73.0 in | Wt 196.0 lb

## 2021-08-28 DIAGNOSIS — F419 Anxiety disorder, unspecified: Secondary | ICD-10-CM

## 2021-08-28 DIAGNOSIS — R42 Dizziness and giddiness: Secondary | ICD-10-CM | POA: Diagnosis not present

## 2021-08-28 DIAGNOSIS — E785 Hyperlipidemia, unspecified: Secondary | ICD-10-CM

## 2021-08-28 DIAGNOSIS — I10 Essential (primary) hypertension: Secondary | ICD-10-CM

## 2021-08-28 LAB — CBC WITH DIFFERENTIAL/PLATELET
Basophils Absolute: 0 10*3/uL (ref 0.0–0.1)
Basophils Relative: 0.4 % (ref 0.0–3.0)
Eosinophils Absolute: 0 10*3/uL (ref 0.0–0.7)
Eosinophils Relative: 0.3 % (ref 0.0–5.0)
HCT: 45.4 % (ref 39.0–52.0)
Hemoglobin: 15.3 g/dL (ref 13.0–17.0)
Lymphocytes Relative: 27.6 % (ref 12.0–46.0)
Lymphs Abs: 1.6 10*3/uL (ref 0.7–4.0)
MCHC: 33.7 g/dL (ref 30.0–36.0)
MCV: 85.1 fl (ref 78.0–100.0)
Monocytes Absolute: 0.4 10*3/uL (ref 0.1–1.0)
Monocytes Relative: 7 % (ref 3.0–12.0)
Neutro Abs: 3.7 10*3/uL (ref 1.4–7.7)
Neutrophils Relative %: 64.7 % (ref 43.0–77.0)
Platelets: 235 10*3/uL (ref 150.0–400.0)
RBC: 5.33 Mil/uL (ref 4.22–5.81)
RDW: 13.4 % (ref 11.5–15.5)
WBC: 5.8 10*3/uL (ref 4.0–10.5)

## 2021-08-28 LAB — COMPREHENSIVE METABOLIC PANEL
ALT: 28 U/L (ref 0–53)
AST: 23 U/L (ref 0–37)
Albumin: 4.8 g/dL (ref 3.5–5.2)
Alkaline Phosphatase: 37 U/L — ABNORMAL LOW (ref 39–117)
BUN: 11 mg/dL (ref 6–23)
CO2: 31 mEq/L (ref 19–32)
Calcium: 9.4 mg/dL (ref 8.4–10.5)
Chloride: 100 mEq/L (ref 96–112)
Creatinine, Ser: 0.92 mg/dL (ref 0.40–1.50)
GFR: 100.52 mL/min (ref >60.00)
Glucose, Bld: 90 mg/dL (ref 70–99)
Potassium: 4.6 mEq/L (ref 3.5–5.1)
Sodium: 138 mEq/L (ref 135–145)
Total Bilirubin: 0.5 mg/dL (ref 0.2–1.2)
Total Protein: 7.2 g/dL (ref 6.0–8.3)

## 2021-08-28 LAB — MAGNESIUM: Magnesium: 2.2 mg/dL (ref 1.5–2.5)

## 2021-08-28 LAB — VITAMIN B12: Vitamin B-12: 312 pg/mL (ref 211–911)

## 2021-08-28 LAB — TSH: TSH: 1.58 u[IU]/mL (ref 0.35–5.50)

## 2021-08-28 MED ORDER — AMLODIPINE BESYLATE 5 MG PO TABS: 5.0000 mg | ORAL_TABLET | Freq: Every day | ORAL | 1 refills | Status: DC

## 2021-08-28 NOTE — Progress Notes (Unsigned)
Enrolled for [employer] to mail a ZIO XT long term holter monitor to the patients address on file.  Letter with instructions mailed to patient. 

## 2021-08-28 NOTE — Patient Instructions (Signed)
It was great to see you!  Restart 5 mg amlodopine/norvasc  We will update your bloodwork today  You will be contacted by cardiology about your cardiac CT scan and heart monitor  Let's follow-up after testing completed, sooner if concerns  Take care,  Inda Coke PA-C

## 2021-08-28 NOTE — Progress Notes (Signed)
Bernard Mora is a 46 y.o. male here for a follow up of a HTN.  History of Present Illness:   Chief Complaint  Patient presents with   Hypertension    Pt blood pressure elevated, has been checking once a week Systolic 389-373, diastolic 42'A consistently. Pt says since last Tuesday has been having some dizziness off and on.    HPI  HTN/Dizziness Bernard Mora presents with concerns of high blood pressure readings which have been occurring for about two weeks. States that since prior visit on 08/09/21, he has been monitoring his BP. According to pt, highest at home reading has been 155/99, while his lowest has been 145/88. Additionally he has been experiencing lightheadedness and slightly blurred vision following his 45 minute cardio workout. Pt was previously on norvasc 5 mg daily but was non- compliant. Patient denies chest pain, SOB, blurred vision, unusual headaches, lower leg swelling. Also denies excessive caffeine intake, stimulant usage, or increase in salt consumption.  Has had some increase in alcohol due to recent family visiting.  BP Readings from Last 3 Encounters:  08/28/21 (!) 144/100  08/09/21 (!) 166/98  04/18/21 132/87   HLD Last lipid panel 10 months ago. LDL was 144. ASCVD was 2.1%. He has not been on medications. He is interested in calcium score.  Anxiety Feels like this is well controlled. Currently on zoloft 25 mg. Has had increase in situational anxiety due to these health issues but feels overall helpful. Denies SI/HI.   Past Medical History:  Diagnosis Date   Allergy    Hyperlipidemia    Hypertension      Social History   Tobacco Use   Smoking status: Never   Smokeless tobacco: Never  Vaping Use   Vaping Use: Never used  Substance Use Topics   Alcohol use: Yes    Alcohol/week: 4.0 - 5.0 standard drinks    Types: 4 - 5 Cans of beer per week   Drug use: No    Past Surgical History:  Procedure Laterality Date   crack ribs Right 07/30/2016   FRACTURE  SURGERY Left 1993   wrist   TONSILLECTOMY      Family History  Problem Relation Age of Onset   Arthritis Mother    Diverticulitis Mother        did require colostomy   Cancer Father        don't know the type, possible kidney/lung and mets   Other Brother        skin lesions   Diabetes Paternal Grandfather     No Known Allergies  Current Medications:   Current Outpatient Medications:    amLODipine (NORVASC) 5 MG tablet, Take 1 tablet (5 mg total) by mouth daily., Disp: 90 tablet, Rfl: 1   calcium citrate-vitamin D (CITRACAL+D) 315-200 MG-UNIT tablet, Take 1 tablet by mouth 2 (two) times daily., Disp: , Rfl:    ibuprofen (ADVIL,MOTRIN) 200 MG tablet, Take 200 mg by mouth every 6 (six) hours as needed., Disp: , Rfl:    Multiple Vitamin (MULTIVITAMIN) tablet, Take 1 tablet by mouth daily., Disp: , Rfl:    Probiotic Product (PROBIOTIC DAILY PO), Take 1 tablet by mouth daily., Disp: , Rfl:    sertraline (ZOLOFT) 25 MG tablet, Take 25 mg by mouth daily., Disp: , Rfl:    Review of Systems:   ROS  Vitals:   Vitals:   08/28/21 1309  BP: (!) 144/100  Pulse: 77  Temp: 98.4 F (36.9 C)  TempSrc: Temporal  SpO2: 98%  Weight: 196 lb (88.9 kg)  Height: 6\' 1"  (1.854 m)     Body mass index is 25.86 kg/m.  Physical Exam:   Physical Exam Vitals and nursing note reviewed.  Constitutional:      General: He is not in acute distress.    Appearance: He is well-developed. He is not ill-appearing or toxic-appearing.  Cardiovascular:     Rate and Rhythm: Normal rate and regular rhythm.     Pulses: Normal pulses.     Heart sounds: Normal heart sounds, S1 normal and S2 normal.  Pulmonary:     Effort: Pulmonary effort is normal.     Breath sounds: Normal breath sounds.  Skin:    General: Skin is warm and dry.  Neurological:     General: No focal deficit present.     Mental Status: He is alert.     GCS: GCS eye subscore is 4. GCS verbal subscore is 5. GCS motor subscore is 6.      Cranial Nerves: Cranial nerves 2-12 are intact.     Sensory: Sensation is intact.     Motor: Motor function is intact.     Coordination: Coordination is intact.  Psychiatric:        Speech: Speech normal.        Behavior: Behavior normal. Behavior is cooperative.    Assessment and Plan:   Dizziness Unclear etiology Neuro exam benign Update blood work Work on controlling BP, will restart norvasc 5 mg Work on ETOH and supplement reduction Stay hydrated Also order zio patch for 1 week for further evaluation and management Follow-up after imaging and blood work if persists ER if worse  Hyperlipidemia, unspecified hyperlipidemia type Order cardiac CT for calcium scoring  Primary hypertension Above goal No evidence of end organ damage Restart norvasc 5 mg daily Check and document BP 3 times a week Work on alcohol and stress reduction Follow-up in 3 months, sooner if concerns  Anxiety Overall well controlled per patient Continue zoloft 25 mg daily Follow-up in 3-6 months, sooner if concerns  I, Bernard Coke, PA, have reviewed all documentation for this visit. The documentation on 08/28/21 for the exam, diagnosis, procedures, and orders are all accurate and complete.  Time spent with patient today was 30 minutes which consisted of chart review, discussing diagnosis, work up, treatment answering questions and documentation.   Bernard Coke, PA-C

## 2021-08-31 DIAGNOSIS — R42 Dizziness and giddiness: Secondary | ICD-10-CM | POA: Diagnosis not present

## 2021-09-19 ENCOUNTER — Other Ambulatory Visit: Payer: Self-pay | Admitting: *Deleted

## 2021-09-19 MED ORDER — SERTRALINE HCL 50 MG PO TABS: 50.0000 mg | ORAL_TABLET | Freq: Every day | ORAL | 2 refills | Status: DC

## 2021-09-25 ENCOUNTER — Telehealth: Payer: Self-pay

## 2021-09-25 NOTE — Telephone Encounter (Signed)
Patient called in and states he suppose to get his CT cardiac scoring done tomorrow and wants to know if he still needs it.

## 2021-09-25 NOTE — Telephone Encounter (Signed)
Please see message and advise 

## 2021-09-25 NOTE — Telephone Encounter (Signed)
Spoke to pt told him per Bernard Mora, Yes, it is still recommended that we get this to evaluate for the presence of plaque around his heart. Pt verbalized understanding.

## 2021-09-26 ENCOUNTER — Other Ambulatory Visit: Payer: Self-pay

## 2021-09-26 ENCOUNTER — Ambulatory Visit (INDEPENDENT_AMBULATORY_CARE_PROVIDER_SITE_OTHER)
Admission: RE | Admit: 2021-09-26 | Discharge: 2021-09-26 | Disposition: A | Discharge status: Home / Self Care | Payer: Self-pay | Source: Ambulatory Visit | Attending: Physician Assistant | Admitting: Physician Assistant

## 2021-09-26 DIAGNOSIS — E785 Hyperlipidemia, unspecified: Secondary | ICD-10-CM

## 2021-10-02 ENCOUNTER — Other Ambulatory Visit: Payer: Self-pay | Admitting: *Deleted

## 2021-10-02 MED ORDER — SERTRALINE HCL 50 MG PO TABS: 50.0000 mg | ORAL_TABLET | Freq: Every day | ORAL | 1 refills | Status: DC

## 2021-10-02 MED ORDER — AMLODIPINE BESYLATE 5 MG PO TABS: 5.0000 mg | ORAL_TABLET | Freq: Every day | ORAL | 1 refills | Status: DC

## 2022-01-16 ENCOUNTER — Encounter: Payer: Self-pay | Admitting: Gastroenterology

## 2022-01-16 ENCOUNTER — Telehealth: Payer: Self-pay | Admitting: Physician Assistant

## 2022-01-16 DIAGNOSIS — Z1211 Encounter for screening for malignant neoplasm of colon: Secondary | ICD-10-CM

## 2022-01-16 NOTE — Telephone Encounter (Signed)
Pt states he was expecting to be called about a referral to GI.  Confirmed in 08/09/2021 AVS: "I am going to put in a referral to [facility] for your colonoscopy Here is the number if you do not hear from them in a few weeks: [phone]"   Central office cannot find record of referral.  Please call patient with update.

## 2022-01-16 NOTE — Telephone Encounter (Signed)
Spoke to pt, apologized for referral not being done. Told him I placed the referral to [facility] and phone number given, you can call later in the week. Pt verbalized understanding. Referral placed in Epic.

## 2022-01-26 ENCOUNTER — Other Ambulatory Visit: Payer: Self-pay | Admitting: Gastroenterology

## 2022-01-26 ENCOUNTER — Ambulatory Visit (AMBULATORY_SURGERY_CENTER): Payer: Managed Care, Other (non HMO)

## 2022-01-26 ENCOUNTER — Other Ambulatory Visit: Payer: Self-pay

## 2022-01-26 VITALS — Ht 73.0 in | Wt 199.8 lb

## 2022-01-26 DIAGNOSIS — Z1211 Encounter for screening for malignant neoplasm of colon: Secondary | ICD-10-CM

## 2022-01-26 MED ORDER — CLENPIQ 10-3.5-12 MG-GM -GM/175ML PO SOLN: 1.0000 | ORAL | 0 refills | Status: DC

## 2022-01-26 NOTE — Progress Notes (Signed)
Denies allergies to eggs or soy products. Denies complication of anesthesia or sedation. Denies use of weight loss medication. Denies use of O2.   Emmi instructions given for colonoscopy.  

## 2022-02-13 ENCOUNTER — Encounter: Payer: Self-pay | Admitting: Gastroenterology

## 2022-02-16 ENCOUNTER — Encounter: Payer: Self-pay | Admitting: Gastroenterology

## 2022-02-16 ENCOUNTER — Ambulatory Visit (AMBULATORY_SURGERY_CENTER): Payer: Managed Care, Other (non HMO) | Admitting: Gastroenterology

## 2022-02-16 VITALS — BP 119/82 | HR 62 | Temp 98.0°F | Resp 13 | Ht 73.0 in | Wt 199.8 lb

## 2022-02-16 DIAGNOSIS — Z1211 Encounter for screening for malignant neoplasm of colon: Secondary | ICD-10-CM

## 2022-02-16 DIAGNOSIS — D122 Benign neoplasm of ascending colon: Secondary | ICD-10-CM

## 2022-02-16 MED ORDER — SODIUM CHLORIDE 0.9 % IV SOLN: 500.0000 mL | Freq: Once | INTRAVENOUS | Status: AC

## 2022-02-16 NOTE — Progress Notes (Signed)
[facility] History and Physical   Primary Care Physician:  Inda Coke, [place]   Reason for Procedure:   Colon cancer screening  Plan:    Screening colonoscopy     HPI: Bernard Mora is a 46 y.o. male undergoing initial average risk screening colonoscopy.  He has no family history of colon cancer and no chronic GI symptoms.    Past Medical History:  Diagnosis Date   Allergy    Anxiety    Arthritis    GERD (gastroesophageal reflux disease)    Hyperlipidemia    Hypertension     Past Surgical History:  Procedure Laterality Date   arm fracture Left    crack ribs Right 07/30/2016   FRACTURE SURGERY Left 1993   wrist   TONSILLECTOMY      Prior to Admission medications   Medication Sig Start Date End Date Taking? Authorizing Provider  amLODipine (NORVASC) 5 MG tablet Take 1 tablet (5 mg total) by mouth daily. 10/02/21  Yes Inda Coke, PA  sertraline (ZOLOFT) 50 MG tablet Take 1 tablet (50 mg total) by mouth daily. 10/02/21  Yes Inda Coke, PA  calcium citrate-vitamin D (CITRACAL+D) 315-200 MG-UNIT tablet Take 1 tablet by mouth 2 (two) times daily. Patient not taking: Reported on 02/16/2022    [provider]  ibuprofen (ADVIL,MOTRIN) 200 MG tablet Take 200 mg by mouth every 6 (six) hours as needed.    [provider]  Multiple Vitamin (MULTIVITAMIN) tablet Take 1 tablet by mouth daily. Patient not taking: Reported on 01/26/2022    [provider]  Probiotic Product (PROBIOTIC DAILY PO) Take 1 tablet by mouth daily.    [provider]    Current Outpatient Medications  Medication Sig Dispense Refill   amLODipine (NORVASC) 5 MG tablet Take 1 tablet (5 mg total) by mouth daily. 90 tablet 1   sertraline (ZOLOFT) 50 MG tablet Take 1 tablet (50 mg total) by mouth daily. 90 tablet 1   calcium citrate-vitamin D (CITRACAL+D) 315-200 MG-UNIT tablet Take 1 tablet by mouth 2 (two) times daily. (Patient not taking: Reported on  02/16/2022)     ibuprofen (ADVIL,MOTRIN) 200 MG tablet Take 200 mg by mouth every 6 (six) hours as needed.     Multiple Vitamin (MULTIVITAMIN) tablet Take 1 tablet by mouth daily. (Patient not taking: Reported on 01/26/2022)     Probiotic Product (PROBIOTIC DAILY PO) Take 1 tablet by mouth daily.     Current Facility-Administered Medications  Medication Dose Route Frequency Provider Last Rate Last Admin   0.9 %  sodium chloride infusion  500 mL Intravenous Once Daryel November, MD        Allergies as of 02/16/2022   (No Known Allergies)    Family History  Problem Relation Age of Onset   Arthritis Mother    Diverticulitis Mother        did require colostomy   Cancer Father        don't know the type, possible kidney/lung and mets   Other Brother        skin lesions   Diabetes Paternal Grandfather    Colon cancer Neg Hx    Esophageal cancer Neg Hx    Rectal cancer Neg Hx    Stomach cancer Neg Hx     Social History   Socioeconomic History   Marital status: Married    Spouse name: Not on file   Number of children: Not on file   Years of education: Not on  file   Highest education level: Not on file  Occupational History   Not on file  Tobacco Use   Smoking status: Never   Smokeless tobacco: Never  Vaping Use   Vaping Use: Never used  Substance and Sexual Activity   Alcohol use: Yes    Alcohol/week: 4.0 - 5.0 standard drinks of alcohol    Types: 4 - 5 Cans of beer per week    Comment: daily beer and wine   Drug use: No   Sexual activity: Yes    Partners: Female  Other Topics Concern   Not on file  Social History Narrative   Live in [place]; originally from [place]   Works in Nurse, learning disability   Married, two children 2.5 and 8   Hobbies: spending time with kids, sports, martial arts, cars   Social Determinants of Health   Financial Resource Strain: Not on file  Food Insecurity: Not on file  Transportation Needs: Not on file  Physical Activity: Not on  file  Stress: Not on file  Social Connections: Not on file  Intimate Partner Violence: Not on file    Review of Systems:  All other review of systems negative except as mentioned in the HPI.  Physical Exam: Vital signs BP (!) 141/78   Pulse 72   Temp 98 F (36.7 C)   Ht '6\' 1"'$  (1.854 m)   Wt 199 lb 12.8 oz (90.6 kg)   SpO2 99%   BMI 26.36 kg/m   General:   Alert,  Well-developed, well-nourished, pleasant and cooperative in NAD Airway:  Mallampati 1 Lungs:  Clear throughout to auscultation.   Heart:  Regular rate and rhythm; no murmurs, clicks, rubs,  or gallops. Abdomen:  Soft, nontender and nondistended. Normal bowel sounds.   Neuro/Psych:  Normal mood and affect. A and O x 3   Alycea Segoviano E. Candis Schatz, MD [facility]

## 2022-02-16 NOTE — Progress Notes (Signed)
Pt's states no medical or surgical changes since previsit or office visit. 

## 2022-02-16 NOTE — Progress Notes (Signed)
Sedate, gd SR, tolerated procedure well, VSS, report to RN 

## 2022-02-16 NOTE — Op Note (Signed)
[place] Patient Name: Bernard Mora Procedure Date: 02/16/2022 8:31 AM MRN: [id number] Endoscopist: Nicki Reaper E. Candis Schatz , MD Age: 46 Referring MD:  Date of Birth: 1976/05/24 Gender: Male Account #: [id number] Procedure:                Colonoscopy Indications:              Screening for colorectal malignant neoplasm, This                            is the patient's first colonoscopy Medicines:                Monitored Anesthesia Care Procedure:                Pre-Anesthesia Assessment:                           - Prior to the procedure, a History and Physical                            was performed, and patient medications and                            allergies were reviewed. The patient's tolerance of                            previous anesthesia was also reviewed. The risks                            and benefits of the procedure and the sedation                            options and risks were discussed with the patient.                            All questions were answered, and informed consent                            was obtained. Prior Anticoagulants: The patient has                            taken no previous anticoagulant or antiplatelet                            agents. ASA Grade Assessment: II - A patient with                            mild systemic disease. After reviewing the risks                            and benefits, the patient was deemed in                            satisfactory condition to undergo the procedure.  After obtaining informed consent, the colonoscope                            was passed under direct vision. Throughout the                            procedure, the patient's blood pressure, pulse, and                            oxygen saturations were monitored continuously. The                            CF [id number] #[id number] was introduced through the anus                            and advanced to the the  terminal ileum, with                            identification of the appendiceal orifice and IC                            valve. The colonoscopy was performed without                            difficulty. The patient tolerated the procedure                            well. The quality of the bowel preparation was                            fair. The terminal ileum, ileocecal valve,                            appendiceal orifice, and rectum were photographed.                            The bowel preparation used was Clenpiq via split                            dose instruction. Scope In: 8:43:40 AM Scope Out: 8:58:02 AM Scope Withdrawal Time: 0 hours 9 minutes 22 seconds  Total Procedure Duration: 0 hours 14 minutes 22 seconds  Findings:                 The perianal and digital rectal examinations were                            normal. Pertinent negatives include normal                            sphincter tone and no palpable rectal lesions.                           A 5 mm polyp was found in the ascending colon. The  polyp was sessile. The polyp was removed with a                            cold snare. Resection and retrieval were complete.                            Estimated blood loss was minimal.                           The exam was otherwise normal throughout the                            examined colon.                           The terminal ileum appeared normal.                           Non-bleeding internal hemorrhoids were found during                            retroflexion. The hemorrhoids were Grade I                            (internal hemorrhoids that do not prolapse).                           No additional abnormalities were found on                            retroflexion. Complications:            No immediate complications. Estimated Blood Loss:     Estimated blood loss was minimal. Impression:               - Preparation of the  colon was fair.                           - One 5 mm polyp in the ascending colon, removed                            with a cold snare. Resected and retrieved.                           - The examined portion of the ileum was normal.                           - Non-bleeding internal hemorrhoids. Recommendation:           - Patient has a contact number available for                            emergencies. The signs and symptoms of potential                            delayed complications were discussed with the  patient. Return to normal activities tomorrow.                            Written discharge instructions were provided to the                            patient.                           - Resume previous diet.                           - Continue present medications.                           - Await pathology results.                           - Repeat colonoscopy in 3 years because the bowel                            preparation was suboptimal.                           - Consider augmented bowel prep or two day prep                            with next colonoscopy. Reynold Mantell E. Candis Schatz, MD 02/16/2022 9:02:57 AM This report has been signed electronically.

## 2022-02-16 NOTE — Progress Notes (Signed)
Called to room to assist during endoscopic procedure.  Patient ID and intended procedure confirmed with present staff. Received instructions for my participation in the procedure from the performing physician.  

## 2022-02-16 NOTE — Patient Instructions (Signed)
Impression/Recommendations:  Polyp and hemorrhoid handouts given to patient.  Resume previous diet. Continue present medications. Await pathology results.  Repeat colonoscopy in 3 years.  YOU HAD AN ENDOSCOPIC PROCEDURE TODAY AT [facility]:   Refer to the procedure report that was given to you for any specific questions about what was found during the examination.  If the procedure report does not answer your questions, please call your gastroenterologist to clarify.  If you requested that your care partner not be given the details of your procedure findings, then the procedure report has been included in a sealed envelope for you to review at your convenience later.  YOU SHOULD EXPECT: Some feelings of bloating in the abdomen. Passage of more gas than usual.  Walking can help get rid of the air that was put into your GI tract during the procedure and reduce the bloating. If you had a lower endoscopy (such as a colonoscopy or flexible sigmoidoscopy) you may notice spotting of blood in your stool or on the toilet paper. If you underwent a bowel prep for your procedure, you may not have a normal bowel movement for a few days.  Please Note:  You might notice some irritation and congestion in your nose or some drainage.  This is from the oxygen used during your procedure.  There is no need for concern and it should clear up in a day or so.  SYMPTOMS TO REPORT IMMEDIATELY:  Following lower endoscopy (colonoscopy or flexible sigmoidoscopy):  Excessive amounts of blood in the stool  Significant tenderness or worsening of abdominal pains  Swelling of the abdomen that is new, acute  Fever of 100F or higher For urgent or emergent issues, a gastroenterologist can be reached at any hour by calling [phone]. Do not use MyChart messaging for urgent concerns.    DIET:  We do recommend a small meal at first, but then you may proceed to your regular diet.  Drink plenty of fluids  but you should avoid alcoholic beverages for 24 hours.  ACTIVITY:  You should plan to take it easy for the rest of today and you should NOT DRIVE or use heavy machinery until tomorrow (because of the sedation medicines used during the test).    FOLLOW UP: Our staff will call the number listed on your records the next business day following your procedure.  We will call around 7:15- 8:00 am to check on you and address any questions or concerns that you may have regarding the information given to you following your procedure. If we do not reach you, we will leave a message.  If you develop any symptoms (ie: fever, flu-like symptoms, shortness of breath, cough etc.) before then, please call [phone].  If you test positive for Covid 19 in the 2 weeks post procedure, please call and report this information to [place].    If any biopsies were taken you will be contacted by phone or by letter within the next 1-3 weeks.  Please call us at [phone] if you have not heard about the biopsies in 3 weeks.    SIGNATURES/CONFIDENTIALITY: You and/or your care partner have signed paperwork which will be entered into your electronic medical record.  These signatures attest to the fact that that the information above on your After Visit Summary has been reviewed and is understood.  Full responsibility of the confidentiality of this discharge information lies with you and/or your care-partner.

## 2022-02-19 ENCOUNTER — Telehealth: Payer: Self-pay

## 2022-02-19 NOTE — Telephone Encounter (Signed)
Follow up call to pt, lm for pt to call if having any difficulty with normal activities or eating and drinking.  Also to call if any other questions or concerns.  

## 2022-02-26 ENCOUNTER — Encounter: Payer: Self-pay | Admitting: Gastroenterology

## 2022-03-06 ENCOUNTER — Telehealth: Payer: Self-pay | Admitting: Physician Assistant

## 2022-03-06 NOTE — Telephone Encounter (Signed)
Left detailed message on personal voicemail, you will need to contact [facility] for Colonoscopy results and next steps, phone number given please call them.

## 2022-03-06 NOTE — Telephone Encounter (Signed)
Patient called stating he has been waiting for someone to call and discuss the results of his colonoscopy completed on 02/16/22. He would like a call back to discuss the results and see if there are any necessary next steps. Patient can be reached at [phone].

## 2022-04-02 ENCOUNTER — Other Ambulatory Visit: Payer: Self-pay | Admitting: Physician Assistant

## 2022-04-04 ENCOUNTER — Other Ambulatory Visit: Payer: Self-pay | Admitting: Physician Assistant

## 2022-06-25 ENCOUNTER — Other Ambulatory Visit: Payer: Self-pay | Admitting: Physician Assistant

## 2022-09-27 ENCOUNTER — Other Ambulatory Visit: Payer: Self-pay | Admitting: Physician Assistant

## 2022-11-15 ENCOUNTER — Ambulatory Visit (INDEPENDENT_AMBULATORY_CARE_PROVIDER_SITE_OTHER): Payer: Managed Care, Other (non HMO) | Admitting: Physician Assistant

## 2022-11-15 ENCOUNTER — Encounter: Payer: Self-pay | Admitting: Physician Assistant

## 2022-11-15 VITALS — BP 130/90 | HR 71 | Temp 97.8°F | Ht 73.0 in | Wt 200.5 lb

## 2022-11-15 DIAGNOSIS — I10 Essential (primary) hypertension: Secondary | ICD-10-CM | POA: Diagnosis not present

## 2022-11-15 DIAGNOSIS — E663 Overweight: Secondary | ICD-10-CM | POA: Diagnosis not present

## 2022-11-15 DIAGNOSIS — E785 Hyperlipidemia, unspecified: Secondary | ICD-10-CM | POA: Diagnosis not present

## 2022-11-15 DIAGNOSIS — Z0000 Encounter for general adult medical examination without abnormal findings: Secondary | ICD-10-CM

## 2022-11-15 DIAGNOSIS — F419 Anxiety disorder, unspecified: Secondary | ICD-10-CM

## 2022-11-15 DIAGNOSIS — Z1159 Encounter for screening for other viral diseases: Secondary | ICD-10-CM

## 2022-11-15 LAB — CBC WITH DIFFERENTIAL/PLATELET
Basophils Absolute: 0 10*3/uL (ref 0.0–0.1)
Basophils Relative: 0.2 % (ref 0.0–3.0)
Eosinophils Absolute: 0.1 10*3/uL (ref 0.0–0.7)
Eosinophils Relative: 1.2 % (ref 0.0–5.0)
HCT: 44.5 % (ref 39.0–52.0)
Hemoglobin: 14.9 g/dL (ref 13.0–17.0)
Lymphocytes Relative: 36.7 % (ref 12.0–46.0)
Lymphs Abs: 1.6 10*3/uL (ref 0.7–4.0)
MCHC: 33.4 g/dL (ref 30.0–36.0)
MCV: 85.6 fl (ref 78.0–100.0)
Monocytes Absolute: 0.3 10*3/uL (ref 0.1–1.0)
Monocytes Relative: 8.2 % (ref 3.0–12.0)
Neutro Abs: 2.3 10*3/uL (ref 1.4–7.7)
Neutrophils Relative %: 53.7 % (ref 43.0–77.0)
Platelets: 215 10*3/uL (ref 150.0–400.0)
RBC: 5.2 Mil/uL (ref 4.22–5.81)
RDW: 13.1 % (ref 11.5–15.5)
WBC: 4.2 10*3/uL (ref 4.0–10.5)

## 2022-11-15 LAB — LIPID PANEL
Cholesterol: 241 mg/dL — ABNORMAL HIGH (ref 0–200)
HDL: 74.9 mg/dL (ref >39.00)
LDL Cholesterol: 141 mg/dL — ABNORMAL HIGH (ref 0–99)
NonHDL: 165.93
Total CHOL/HDL Ratio: 3
Triglycerides: 126 mg/dL (ref 0.0–149.0)
VLDL: 25.2 mg/dL (ref 0.0–40.0)

## 2022-11-15 LAB — COMPREHENSIVE METABOLIC PANEL
ALT: 25 U/L (ref 0–53)
AST: 22 U/L (ref 0–37)
Albumin: 4.4 g/dL (ref 3.5–5.2)
Alkaline Phosphatase: 51 U/L (ref 39–117)
BUN: 11 mg/dL (ref 6–23)
CO2: 31 mEq/L (ref 19–32)
Calcium: 9.2 mg/dL (ref 8.4–10.5)
Chloride: 102 mEq/L (ref 96–112)
Creatinine, Ser: 0.98 mg/dL (ref 0.40–1.50)
GFR: 92.39 mL/min (ref >60.00)
Glucose, Bld: 105 mg/dL — ABNORMAL HIGH (ref 70–99)
Potassium: 4 mEq/L (ref 3.5–5.1)
Sodium: 140 mEq/L (ref 135–145)
Total Bilirubin: 0.4 mg/dL (ref 0.2–1.2)
Total Protein: 6.9 g/dL (ref 6.0–8.3)

## 2022-11-15 LAB — HEMOGLOBIN A1C: Hgb A1c MFr Bld: 5.9 % (ref 4.6–6.5)

## 2022-11-15 MED ORDER — AMLODIPINE BESYLATE 5 MG PO TABS: 5.0000 mg | ORAL_TABLET | Freq: Every day | ORAL | 3 refills | Status: DC

## 2022-11-15 MED ORDER — SERTRALINE HCL 50 MG PO TABS: 75.0000 mg | ORAL_TABLET | Freq: Every day | ORAL | 3 refills | Status: DC

## 2022-11-15 NOTE — Patient Instructions (Addendum)
It was great to see you! ? ?Please go to the lab for blood work.  ? ?Our office will call you with your results unless you have chosen to receive results via MyChart. ? ?If your blood work is normal we will follow-up each year for physicals and as scheduled for chronic medical problems. ? ?If anything is abnormal we will treat accordingly and get you in for a follow-up. ? ?Take care, ? ?Shawnetta Lein ?  ? ? ?

## 2022-11-15 NOTE — Progress Notes (Signed)
Subjective:    Bernard Mora is a 47 y.o. male and is here for a comprehensive physical exam.  HPI  Health Maintenance Due  Topic Date Due   Hepatitis C Screening  Never done    Acute Concerns: None  Chronic Issues: HTN:  He has been monitoring her blood pressure at home and reports a reading of 135/100 yesterday.  He did not take his antihypertensive today.  He is otherwise compliant with Amlodipine, 5 mg daily.   Anxiety: He is compliant with Zoloft, 75 mg daily.  Feels anxiety is well controlled.   Health Maintenance: Immunizations -- UTD Colonoscopy -- 02/16/2022. Results were: Normal PSA --  Lab Results  Component Value Date   PSA 0.39 05/26/2018   PSA 0.52 05/27/2017   Diet -- Healthier and cleaner than prior and avoids fast food. Avoids table salt, sugar, and oil. Drinks 2-3 cups of decaf or half caf before lunch. Drinks sugar free Gatorade and plenty water. Eats plenty fruits.   Sleep habits -- Overall well.  Exercise -- Stopped weight training but continues to try to stay active.   Weight --  Recent weight history Wt Readings from Last 10 Encounters:  11/15/22 200 lb 8 oz (90.9 kg)  02/16/22 199 lb 12.8 oz (90.6 kg)  01/26/22 199 lb 12.8 oz (90.6 kg)  08/28/21 196 lb (88.9 kg)  08/09/21 198 lb 3.2 oz (89.9 kg)  04/18/21 185 lb (83.9 kg)  10/19/20 196 lb (88.9 kg)  05/29/19 185 lb (83.9 kg)  10/28/18 183 lb (83 kg)  08/12/18 182 lb 3.2 oz (82.6 kg)   Body mass index is 26.45 kg/m.  Mood -- Stable. Compliant with Zoloft. Alcohol use --  reports current alcohol use of about 4.0 - 5.0 standard drinks of alcohol per week.  Tobacco use --  Tobacco Use: Low Risk  (11/15/2022)   Patient History    Smoking Tobacco Use: Never    Smokeless Tobacco Use: Never    Passive Exposure: Not on file    Eligible for Low Dose CT? no  UTD with eye doctor? yes UTD with dentist? yes     11/15/2022    8:50 AM  Depression screen PHQ 2/9  Decreased Interest 0   Down, Depressed, Hopeless 0  PHQ - 2 Score 0  Altered sleeping 1  Tired, decreased energy 1  Change in appetite 0  Feeling bad or failure about yourself  0  Trouble concentrating 0  Moving slowly or fidgety/restless 0  Suicidal thoughts 0  PHQ-9 Score 2  Difficult doing work/chores Not difficult at all    Other providers/specialists: Patient Care Team: Inda Coke, [place] as PCP - General (Physician Assistant)    PMHx, SurgHx, SocialHx, Medications, and Allergies were reviewed in the Visit Navigator and updated as appropriate.   Past Medical History:  Diagnosis Date   Allergy    Anxiety    Arthritis    GERD (gastroesophageal reflux disease)    Hyperlipidemia    Hypertension      Past Surgical History:  Procedure Laterality Date   arm fracture Left    crack ribs Right 07/30/2016   FRACTURE SURGERY Left 1993   wrist   TONSILLECTOMY       Family History  Problem Relation Age of Onset   Arthritis Mother    Diverticulitis Mother        did require colostomy   Cancer Father        don't know the type, possible  kidney/lung and mets   Other Brother        skin lesions   Diabetes Paternal Grandfather    Colon cancer Neg Hx    Esophageal cancer Neg Hx    Rectal cancer Neg Hx    Stomach cancer Neg Hx     Social History   Tobacco Use   Smoking status: Never   Smokeless tobacco: Never  Vaping Use   Vaping Use: Never used  Substance Use Topics   Alcohol use: Yes    Alcohol/week: 4.0 - 5.0 standard drinks of alcohol    Types: 4 - 5 Cans of beer per week    Comment: daily beer and wine   Drug use: No    Review of Systems:   Review of Systems  Constitutional:  Negative for chills, fever, malaise/fatigue and weight loss.  HENT:  Negative for hearing loss, sinus pain and sore throat.   Respiratory:  Negative for cough and hemoptysis.   Cardiovascular:  Negative for chest pain, palpitations, leg swelling and PND.  Gastrointestinal:  Negative for abdominal  pain, constipation, diarrhea, heartburn, nausea and vomiting.  Genitourinary:  Negative for dysuria, frequency and urgency.  Musculoskeletal:  Negative for back pain, myalgias and neck pain.  Skin:  Negative for itching and rash.  Neurological:  Negative for dizziness, tingling, seizures and headaches.  Endo/Heme/Allergies:  Negative for polydipsia.  Psychiatric/Behavioral:  Negative for depression. The patient is not nervous/anxious.     Objective:    Vitals:   11/15/22 0850  BP: (!) 140/90  Pulse: 71  Temp: 97.8 F (36.6 C)  SpO2: 98%    Body mass index is 26.45 kg/m.  General  Alert, cooperative, no distress, appears stated age  Head:  Normocephalic, without obvious abnormality, atraumatic  Eyes:  PERRL, conjunctiva/corneas clear, EOM's intact, fundi benign, both eyes       Ears:  Normal TM's and external ear canals, both ears  Nose: Nares normal, septum midline, mucosa normal, no drainage or sinus tenderness  Throat: Lips, mucosa, and tongue normal; teeth and gums normal  Neck: Supple, symmetrical, trachea midline, no adenopathy;     thyroid:  No enlargement/tenderness/nodules; no carotid bruit or JVD  Back:   Symmetric, no curvature, ROM normal, no CVA tenderness  Lungs:   Clear to auscultation bilaterally, respirations unlabored  Chest wall:  No tenderness or deformity  Heart:  Regular rate and rhythm, S1 and S2 normal, no murmur, rub or gallop  Abdomen:   Soft, non-tender, bowel sounds active all four quadrants, no masses, no organomegaly  Extremities: Extremities normal, atraumatic, no cyanosis or edema  Prostate : Deferred   Skin: Skin color, texture, turgor normal, no rashes or lesions  Lymph nodes: Cervical, supraclavicular, and axillary nodes normal  Neurologic: CNII-XII grossly intact. Normal strength, sensation and reflexes throughout   AssessmentPlan:   Routine physical examination Today patient counseled on age appropriate routine health concerns for  screening and prevention, each reviewed and up to date or declined. Immunizations reviewed and up to date or declined. Labs ordered and reviewed. Risk factors for depression reviewed and negative. Hearing function and visual acuity are intact. ADLs screened and addressed as needed. Functional ability and level of safety reviewed and appropriate. Education, counseling and referrals performed based on assessed risks today. Patient provided with a copy of personalized plan for preventive services.  Anxiety Well controlled Continue zoloft 75 mg daily Follow-up in 1 year, sooner if concerns  Primary hypertension Above goal No evidence of  end-organ damage on my exam Recommend ETOH reduction Continue home monitoring If BP remains above goal with alcohol reduction, recommend he call/message [place] and we can increase amlodopine to 10 mg daily  Hyperlipidemia, unspecified hyperlipidemia type Update lipid panel and provide recommendations accordingly  Overweight Continue healthy lifestyle efforts  Encounter for screening for other viral diseases Update Hep C   I,Rachel Rivera,acting as a scribe for [employer], PA.,have documented all relevant documentation on the behalf of Inda Coke, PA,as directed by  Inda Coke, PA while in the presence of Inda Coke, [place].  I, Inda Coke, [place], have reviewed all documentation for this visit. The documentation on 11/15/22 for the exam, diagnosis, procedures, and orders are all accurate and complete.  Inda Coke, PA-C [place]

## 2022-11-16 LAB — HEPATITIS C ANTIBODY: Hepatitis C Ab: NONREACTIVE

## 2022-11-22 ENCOUNTER — Encounter: Payer: Self-pay | Admitting: Physician Assistant

## 2022-12-26 ENCOUNTER — Other Ambulatory Visit: Payer: Self-pay | Admitting: Physician Assistant

## 2023-03-22 ENCOUNTER — Telehealth (INDEPENDENT_AMBULATORY_CARE_PROVIDER_SITE_OTHER): Payer: Managed Care, Other (non HMO) | Admitting: Family

## 2023-03-22 VITALS — BP 124/77

## 2023-03-22 DIAGNOSIS — J069 Acute upper respiratory infection, unspecified: Secondary | ICD-10-CM

## 2023-03-22 MED ORDER — BENZONATATE 100 MG PO CAPS: 100.0000 mg | ORAL_CAPSULE | Freq: Three times a day (TID) | ORAL | 0 refills | Status: DC | PRN

## 2023-03-22 NOTE — Progress Notes (Signed)
MyChart Video Visit    Virtual Visit via Video Note    Patient location: Home. Patient and provider in visit Provider location: Office  I discussed the limitations of evaluation and management by telemedicine and the availability of in person appointments. The patient expressed understanding and agreed to proceed.  Visit Date: 03/22/2023  Today's healthcare provider: Lemont Fillers, NP     Subjective:    Patient ID: Bernard Mora, male    DOB: 02/17/1976, 47 y.o.   MRN: [id number]  Chief Complaint  Patient presents with   Nasal Congestion    Patient complains of congestion for over a week   Cough    Complains of cough    HPI Pt gave verbal consent for use of AI scribe.  The patient, with a history of hypertension, presents with upper respiratory symptoms that began five days ago. He reports that his wife had a similar illness ten days prior to the onset of his symptoms. The patient describes progressive nasal congestion, sneezing, coughing, and postnasal drainage, which has affected his voice. He has been using Sudafed for symptom relief, which has been effective in reducing nasal congestion. He denies fever, body aches, and sore throat. He has not noticed any significant headaches, only mild discomfort in the morning likely related to congestion. He reports feeling tired and drowsy. The patient has not taken a COVID-19 test but his wife tested negative. He has been taking his blood pressure medication as prescribed and his most recent reading was 124/77.   Past Medical History:  Diagnosis Date   Allergy    Anxiety    Arthritis    GERD (gastroesophageal reflux disease)    Hyperlipidemia    Hypertension     Past Surgical History:  Procedure Laterality Date   arm fracture Left    crack ribs Right 07/30/2016   FRACTURE SURGERY Left 1993   wrist   TONSILLECTOMY      Family History  Problem Relation Age of Onset   Arthritis Mother    Diverticulitis Mother         did require colostomy   Cancer Father        don't know the type, possible kidney/lung and mets   Other Brother        skin lesions   Diabetes Paternal Grandfather    Colon cancer Neg Hx    Esophageal cancer Neg Hx    Rectal cancer Neg Hx    Stomach cancer Neg Hx     Social History   Socioeconomic History   Marital status: Married    Spouse name: Not on file   Number of children: Not on file   Years of education: Not on file   Highest education level: Not on file  Occupational History   Not on file  Tobacco Use   Smoking status: Never   Smokeless tobacco: Never  Vaping Use   Vaping status: Never Used  Substance and Sexual Activity   Alcohol use: Yes    Alcohol/week: 4.0 - 5.0 standard drinks of alcohol    Types: 4 - 5 Cans of beer per week    Comment: daily beer and wine   Drug use: No   Sexual activity: Yes    Partners: Female  Other Topics Concern   Not on file  Social History Narrative   Live in [place]; originally from [place]   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 90 tablet 3   calcium citrate-vitamin D (CITRACAL+D) 315-200 MG-UNIT tablet Take 1 tablet by mouth 2 (two) times daily.     ibuprofen (ADVIL,MOTRIN) 200 MG tablet Take 200 mg by mouth every 6 (six) hours as needed.     Multiple Vitamin (MULTIVITAMIN) tablet Take 1 tablet by mouth daily.     sertraline (ZOLOFT) 50 MG tablet Take 1 tablet (50 mg total) by mouth daily. 90 tablet 1    Facility-Administered Medications Prior to Visit  Medication Dose Route Frequency Provider Last Rate Last Admin   0.9 %  sodium chloride infusion  500 mL Intravenous Once Jenel Lucks, MD        No Known Allergies  ROS    See HPI Objective:    Physical Exam  BP 124/77  Wt Readings from Last 3 Encounters:  11/15/22 200 lb 8 oz (90.9 kg)  02/16/22 199 lb 12.8 oz (90.6 kg)  01/26/22 199 lb 12.8 oz (90.6 kg)   Gen: Awake, alert, no acute distress Resp: Breathing is even and non-labored Psych: calm/pleasant demeanor Neuro: Alert and Oriented x 3, + facial symmetry, speech is clear.      Assessment & Plan:   Problem List Items Addressed This Visit       Unprioritized   Viral URI with cough - Primary    Symptoms of congestion, sneezing, and coughing for 5-6 days. No fever, body aches, or sore throat. Symptoms consistent with viral infection, possibly COVID-19. -Continue symptom management with over-the-counter medications. -Replace Sudafed with Coricidin due to hypertension. -Consider taking Tessalon Perles for cough as needed. -Consider COVID-19 testing for peace of mind and to prevent potential spread. -If symptoms do not improve by Monday, contact provider for potential antibiotic treatment.       I am having Bernard Mora start on benzonatate. I am also having him maintain his multivitamin, ibuprofen, calcium citrate-vitamin D, amLODipine, and sertraline. We will continue to administer sodium chloride.  Meds ordered this encounter  Medications   benzonatate (TESSALON) 100 MG capsule    Sig: Take 1 capsule (100 mg total) by mouth 3 (three) times daily as needed.    Dispense:  20 capsule    Refill:  0    Order Specific Question:   Supervising Provider    Answer:   Danise Edge A [[id number]]    I discussed the assessment and treatment plan with the patient. The patient was provided an opportunity to ask questions and all were answered. The patient agreed with the  plan and demonstrated an understanding of the instructions.   The patient was advised to call back or seek an in-person evaluation if the symptoms worsen or if the condition fails to improve as anticipated.     Lemont Fillers, NP [facility] [phone] (phone) [phone] (fax)  [facility]

## 2023-03-22 NOTE — Assessment & Plan Note (Signed)
Symptoms of congestion, sneezing, and coughing for 5-6 days. No fever, body aches, or sore throat. Symptoms consistent with viral infection, possibly COVID-19. -Continue symptom management with over-the-counter medications. -Replace Sudafed with Coricidin due to hypertension. -Consider taking Tessalon Perles for cough as needed. -Consider COVID-19 testing for peace of mind and to prevent potential spread. -If symptoms do not improve by Monday, contact provider for potential antibiotic treatment.

## 2023-03-22 NOTE — Patient Instructions (Signed)
VISIT SUMMARY:  During your visit, we discussed your upper respiratory symptoms that started five days ago, which include nasal congestion, sneezing, coughing, and postnasal drainage. We also reviewed your history of high blood pressure.  YOUR PLAN:  -UPPER RESPIRATORY INFECTION: This is a common illness that affects your nose, throat, and airways. It's usually caused by a virus. You should continue managing your symptoms with over-the-counter medications. However, switch from Sudafed to Coricidin because Sudafed can increase blood pressure. If your cough becomes bothersome, consider taking Tessalon Perles. You might also want to get a COVID-19 test for peace of mind and to prevent potential spread. If your symptoms don't improve by Monday, please contact me for potential antibiotic treatment.  -HYPERTENSION: This is a condition where the force of the blood against your artery walls is too high. You should continue with your current blood pressure medication. Remember to avoid Sudafed as it can increase your blood pressure.  INSTRUCTIONS:  Please monitor your symptoms closely. If they do not improve by Monday, or if they worsen at any point, please contact me immediately. Also, consider getting a COVID-19 test. Continue taking your blood pressure medication as prescribed, and avoid Sudafed due to its potential to increase blood pressure.

## 2023-03-26 ENCOUNTER — Other Ambulatory Visit: Payer: Self-pay | Admitting: Physician Assistant

## 2023-04-10 IMAGING — CT CT CARDIAC CORONARY ARTERY CALCIUM SCORE
3 series · 14 of 20 positions shown, 16 images · non-contrast
Comparison: None.
COMPARISON: None.

Addendum:
EXAM:
OVER-READ INTERPRETATION  CT CHEST

The following report is an over-read performed by radiologist Dr.
Wiluyo Barunawati [REDACTED] on 09/26/2021. This
over-read does not include interpretation of cardiac or coronary
anatomy or pathology. The coronary calcium score interpretation by
the cardiologist is attached.
CLINICAL DATA: Risk stratification
Coronary Calcium Score
TECHNIQUE: The patient was scanned on a Siemens Somatom 64 slice scanner. Axial
non-contrast 3 mm slices were carried out through the heart. The
data set was analyzed on a dedicated work station and scored using
the Agatson method.

[Series 2: cascseq 2.0 sa36 70% (id) · axial · 0.40mm/px · z∈[-264,-162]mm · 4 of 85 slices shown]
[im 17/85  vessel]
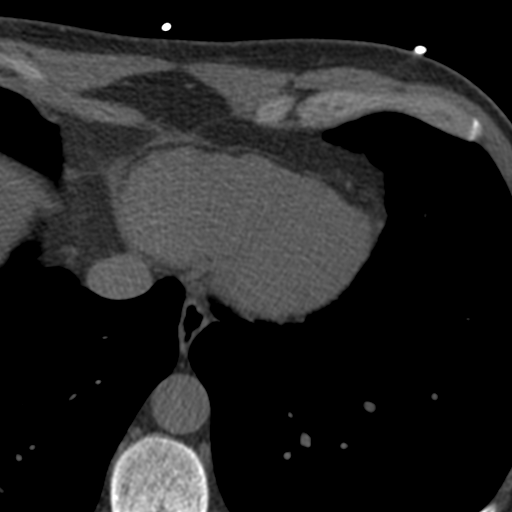
[im 34/85  vessel]
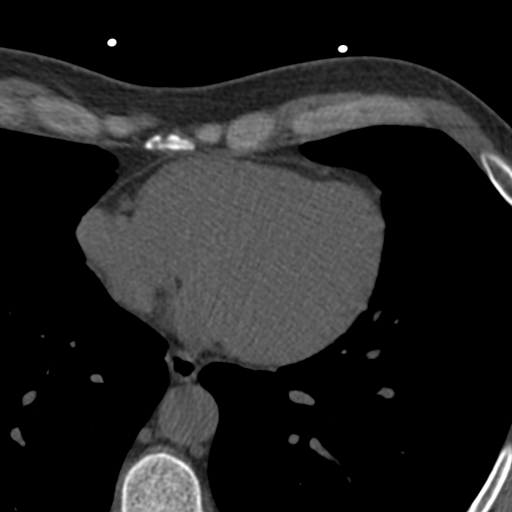
[im 51/85  vessel]
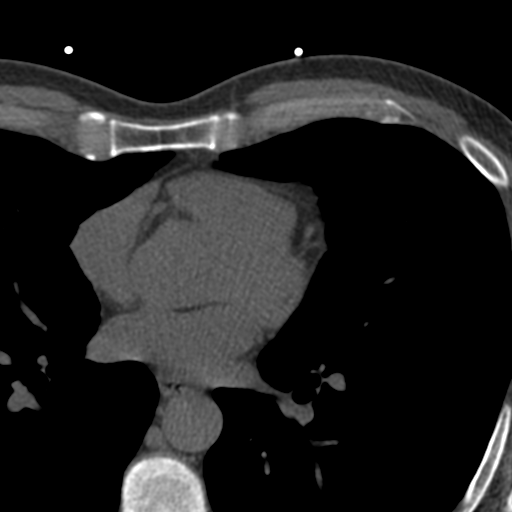
[im 68/85  vessel]
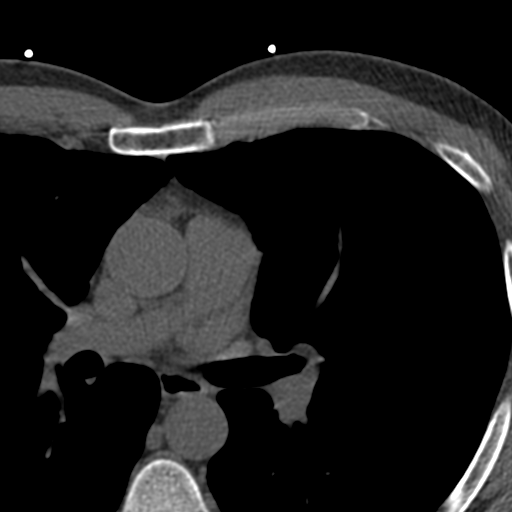

[Series 3: cascseq 2.0 bf37 st · axial · 0.79mm/px · z∈[-268,-156]mm · 5 of 85 slices shown, 7 images]
[im 15/85  vessel]
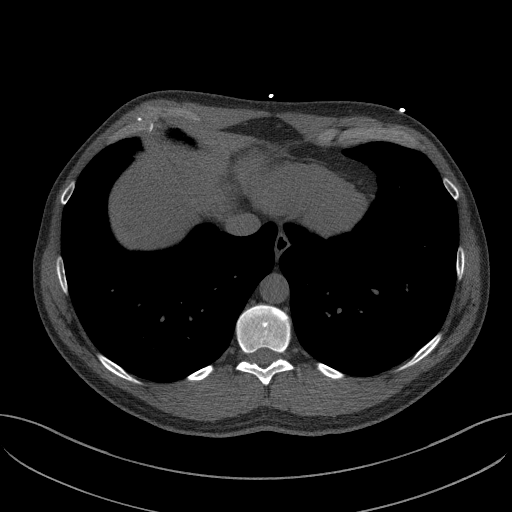
[im 15/85  lung]
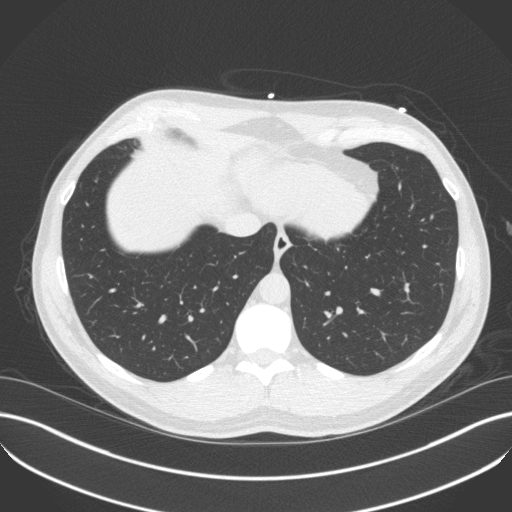
[im 29/85  vessel]
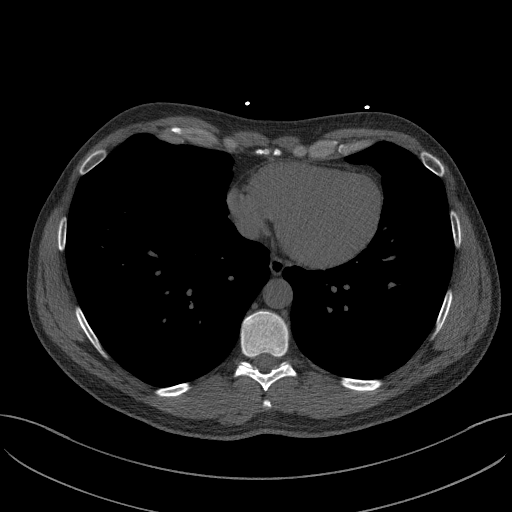
[im 43/85  vessel]
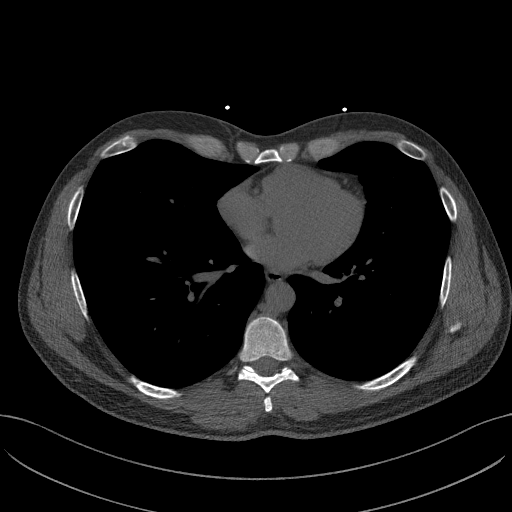
[im 57/85  vessel]
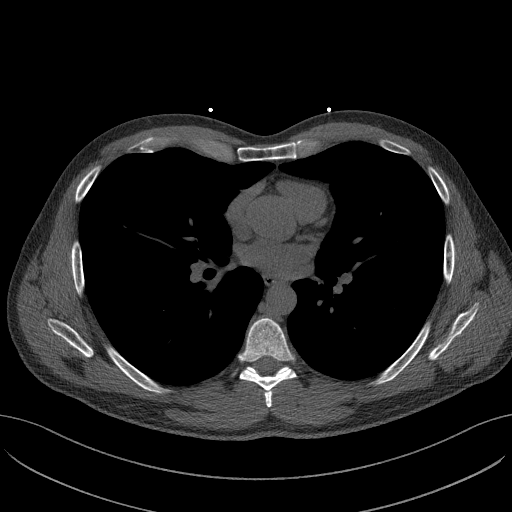
[im 71/85  vessel]
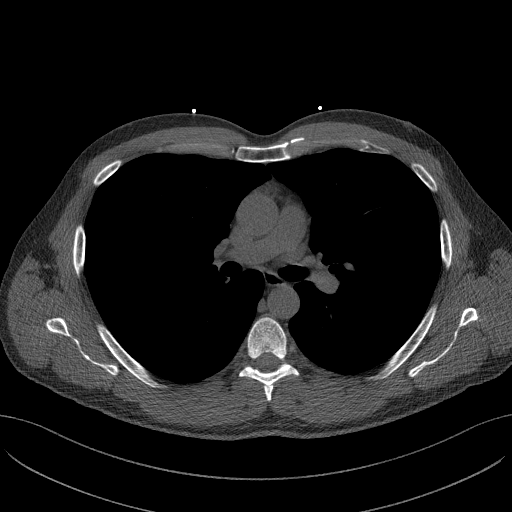
[im 71/85  lung]
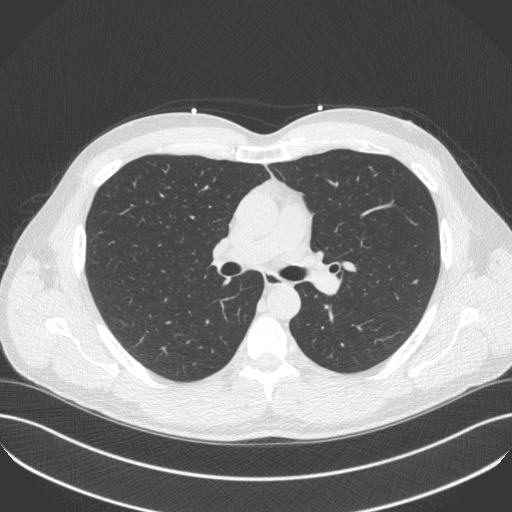

[Series 4: cascseq 2.0 br59 lung · axial · 0.79mm/px · z∈[-268,-156]mm · 5 of 85 slices shown]
[im 15/85  lung]
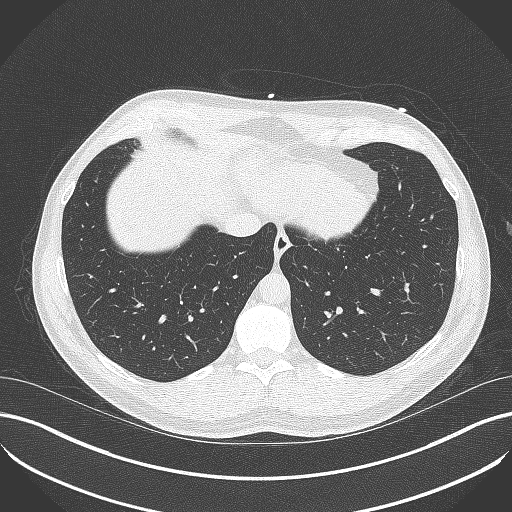
[im 29/85  lung]
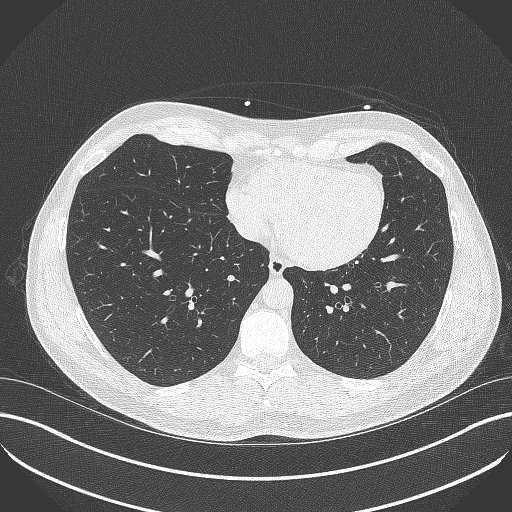
[im 43/85  lung]
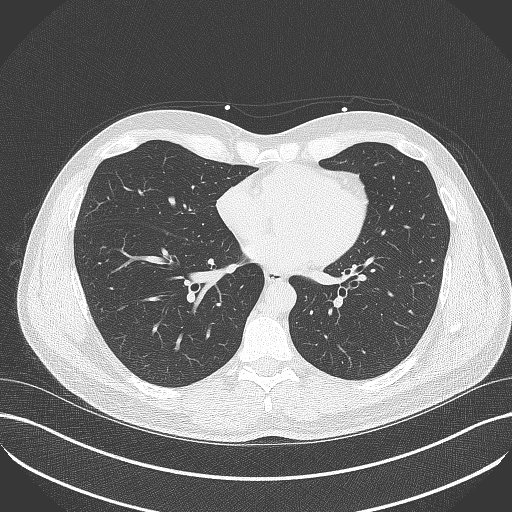
[im 57/85  lung]
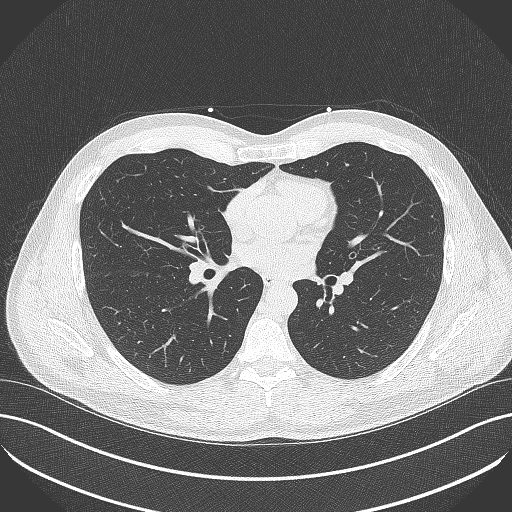
[im 71/85  lung]
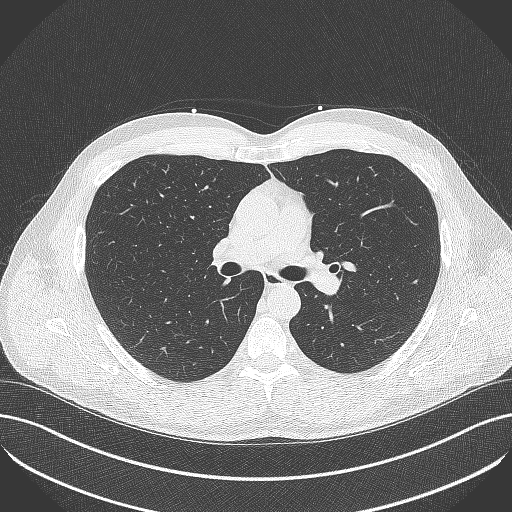

[14 of 20 positions shown; findings below may reference images not displayed]

FINDINGS: Multiple small pulmonary nodules are scattered throughout both
lungs, largest of which measures 5 mm in the right middle lobe
(axial image 25 of series 4). Within the visualized portions of the
thorax there are no larger more suspicious appearing pulmonary
nodules or masses, there is no acute consolidative airspace disease,
no pleural effusions, no pneumothorax and no lymphadenopathy.
Visualized portions of the upper abdomen demonstrates mild diffuse
low attenuation throughout the visualized hepatic parenchyma,
indicative of a background of mild hepatic steatosis. There are no
aggressive appearing lytic or blastic lesions noted in the
visualized portions of the skeleton.
IMPRESSION: 1. Multiple small pulmonary nodules measuring 5 mm or less in size,
nonspecific, but statistically likely benign. No follow-up needed if
patient is low-risk (and has no known or suspected primary
neoplasm). Non-contrast chest CT can be considered in 12 months if
patient is high-risk. This recommendation follows the consensus
statement: Guidelines for Management of Incidental Pulmonary Nodules
Detected on CT Images: From the [HOSPITAL] 7482; Radiology
2. Mild hepatic steatosis.
FINDINGS: Non-cardiac: See separate report from [REDACTED].

Ascending aorta: Normal diameter 3.2 cm

Pericardium: Normal

Coronary arteries: No calcium noted
IMPRESSION: Coronary calcium score of 0.   .

Anyara Firme

*** End of Addendum ***
EXAM:
OVER-READ INTERPRETATION  CT CHEST

The following report is an over-read performed by radiologist Dr.
Wiluyo Barunawati [REDACTED] on 09/26/2021. This
over-read does not include interpretation of cardiac or coronary
anatomy or pathology. The coronary calcium score interpretation by
the cardiologist is attached.
FINDINGS: Multiple small pulmonary nodules are scattered throughout both
lungs, largest of which measures 5 mm in the right middle lobe
(axial image 25 of series 4). Within the visualized portions of the
thorax there are no larger more suspicious appearing pulmonary
nodules or masses, there is no acute consolidative airspace disease,
no pleural effusions, no pneumothorax and no lymphadenopathy.
Visualized portions of the upper abdomen demonstrates mild diffuse
low attenuation throughout the visualized hepatic parenchyma,
indicative of a background of mild hepatic steatosis. There are no
aggressive appearing lytic or blastic lesions noted in the
visualized portions of the skeleton.
IMPRESSION: 1. Multiple small pulmonary nodules measuring 5 mm or less in size,
nonspecific, but statistically likely benign. No follow-up needed if
patient is low-risk (and has no known or suspected primary
neoplasm). Non-contrast chest CT can be considered in 12 months if
patient is high-risk. This recommendation follows the consensus
statement: Guidelines for Management of Incidental Pulmonary Nodules
Detected on CT Images: From the [HOSPITAL] 7482; Radiology
2. Mild hepatic steatosis.

## 2023-06-06 ENCOUNTER — Other Ambulatory Visit: Payer: Self-pay | Admitting: Physician Assistant

## 2023-09-09 ENCOUNTER — Ambulatory Visit: Payer: Self-pay | Admitting: Physician Assistant

## 2023-09-09 NOTE — Telephone Encounter (Signed)
FYI, See Triage note. Pt scheduled tomorrow.

## 2023-09-09 NOTE — Telephone Encounter (Signed)
  Chief Complaint: R eye lid swelling Symptoms: slight redness to the eye lid, swelling, blurry vision Frequency: 3 days ago  Pertinent Negatives: Patient denies fever, red streaking,    Disposition: [] ED /[] Urgent Care (no appt availability in office) / [x] Appointment(In office/virtual)/ []  [facility]/ [] Home Care/ [] Refused Recommended Disposition /[] [place] [facility]/ []  Follow-up with PCP  Additional Notes: Pt states that he noticed that the R eye lid was swollen about 3 days ago, per pt it is not swollen shut, about twice the size of the L lid. Pt denies redness to the eyeball, states that there is no itching, but that he developed a little blurry vision today. Pt states that he has been using his sons old abx eye drops that he had for pink eye, does not seem to be helping. Pt states that for about the last 3 weeks he was waking each morning with significant bilateral eye drainage, believed the drainage was d/t his pillow case and being allergic to something on it. Pt states that he changed out the pillow case and seems to have less drainage. Pt denies injury to his eye.  Pt sched for tomorrow.  Copied from CRM [id number]. Topic: Clinical - Red Word Triage >> Sep 09, 2023  1:40 PM Bernard Mora wrote: [facility] that prompted transfer to Nurse Triage: Patient complaining of swollen right eye upper eye lid - started about 3 days ago - mentioned today that it has started to blur some of his vision on the right side. Reason for Disposition  [1] MILD eyelid swelling (puffiness) AND [2] persists > 3 days  (Exception: Suspect mosquito bites.)  Answer Assessment - Initial Assessment Questions 1. ONSET: "When did the swelling start?" (e.g., minutes, hours, days)     R eye lid 2. LOCATION: "What part of the eyelids is swollen?"     R 3. SEVERITY: "How swollen is it?"     Not swollen shut, seems to be about double the size of the L/normal eye lid 4. ITCHING: "Is there any itching?" If Yes,  ask: "How much?"   (Scale 1-10; mild, moderate or severe)     denies 5. PAIN: "Is the swelling painful to touch?" If Yes, ask: "How painful is it?"   (Scale 1-10; mild, moderate or severe)     0 6. FEVER: "Do you have a fever?" If Yes, ask: "What is it, how was it measured, and when did it start?"      denies 7. CAUSE: "What do you think is causing the swelling?"     Denies, using eyedrops d/t dry eyes 8. RECURRENT SYMPTOM: "Have you had eyelid swelling before?" If Yes, ask: "When was the last time?" "What happened that time?"     denies 9. OTHER SYMPTOMS: "Do you have any other symptoms?" (e.g., blurred vision, eye discharge, rash, runny nose)     Blurred vision in that eye started today. Both eyes having drainage for about 2-3 weeks. States he changed his pillow cases and the bilateral eye drainage has lessened.  Protocols used: Eye - Swelling-A-AH

## 2023-09-10 ENCOUNTER — Encounter: Payer: Self-pay | Admitting: Physician Assistant

## 2023-09-10 ENCOUNTER — Ambulatory Visit: Payer: Managed Care, Other (non HMO) | Admitting: Physician Assistant

## 2023-09-10 VITALS — BP 150/98 | HR 78 | Temp 97.5°F | Ht 73.0 in | Wt 207.0 lb

## 2023-09-10 DIAGNOSIS — R197 Diarrhea, unspecified: Secondary | ICD-10-CM

## 2023-09-10 DIAGNOSIS — H5711 Ocular pain, right eye: Secondary | ICD-10-CM | POA: Diagnosis not present

## 2023-09-10 LAB — POC INFLUENZA A&B (BINAX/QUICKVUE)
Influenza A, POC: NEGATIVE
Influenza B, POC: NEGATIVE

## 2023-09-10 MED ORDER — ERYTHROMYCIN 5 MG/GM OP OINT: TOPICAL_OINTMENT | OPHTHALMIC | 0 refills | Status: DC

## 2023-09-10 NOTE — Progress Notes (Signed)
Bernard Mora is a 48 y.o. male here for a new problem.  History of Present Illness:   Chief Complaint  Patient presents with   Eye Problem    Pt c/o right eye swelling since Thursday, has been using OTC eye drops for dry eyes. Also eyes are having drainage when waking up in the morning.   Nausea    Pt c/o nausea since Sunday morning and liquid stools. Denies fever, no headaches.    Swollen Eye He complains today of a swollen right eye that has persisted since Thursday. He has also been experiencing accompanying crusted eye drainage that she typically notice's after waking up. He's also experiencing blurred vision.  He denies any accompanying itchiness.  He's been using OTC eye drops (red eye relief) and antibiotic eye drop which he applied once daily since Sunday.  He does report experiencing severe allergies the past 3 months and recently moving into a new house wonders if this has contributed to his current symptoms.  He does report taking OTC allergy medication but has not taking it for the past 3 days.  No similar symptoms with other family members.  He started wearing glasses last year. Almost daily.   Nausea // Diarrhea He also complains of nausea that started this past Sunday. He reports eating a cream soup that day. Family members are not exhibiting similar symptoms.  He has been experiencing accompanying abdominal cramping and diarrhea, stating his stool is straight up liquid. He does report alternating between feeling hot or cold. He does report taking imodium and Tums without much relief.   Denies any fevers, coughs, runny nose.  He avoids most diary products as it causes discomfort.  Does report a family history of IBS-diarrhea. Flu test in office today is negative.  Past Medical History:  Diagnosis Date   Allergy    Anxiety    Arthritis    GERD (gastroesophageal reflux disease)    Hyperlipidemia    Hypertension      Social History   Tobacco Use   Smoking  status: Never   Smokeless tobacco: Never  Vaping Use   Vaping status: Never Used  Substance Use Topics   Alcohol use: Yes    Alcohol/week: 4.0 - 5.0 standard drinks of alcohol    Types: 4 - 5 Cans of beer per week    Comment: daily beer and wine   Drug use: No    Past Surgical History:  Procedure Laterality Date   arm fracture Left    crack ribs Right 07/30/2016   FRACTURE SURGERY Left 1993   wrist   TONSILLECTOMY      Family History  Problem Relation Age of Onset   Arthritis Mother    Diverticulitis Mother        did require colostomy   Cancer Father        don't know the type, possible kidney/lung and mets   Other Brother        skin lesions   Diabetes Paternal Grandfather    Colon cancer Neg Hx    Esophageal cancer Neg Hx    Rectal cancer Neg Hx    Stomach cancer Neg Hx     No Known Allergies  Current Medications:   Current Outpatient Medications:    amLODipine (NORVASC) 5 MG tablet, TAKE 1 TABLET DAILY, Disp: 90 tablet, Rfl: 1   benzonatate (TESSALON) 100 MG capsule, Take 1 capsule (100 mg total) by mouth 3 (three) times daily as needed., Disp: 20  capsule, Rfl: 0   calcium citrate-vitamin D (CITRACAL+D) 315-200 MG-UNIT tablet, Take 1 tablet by mouth 2 (two) times daily., Disp: , Rfl:    erythromycin ophthalmic ointment, Apply 1 thin ribbon of ointment to eye twice daily x 3-5 days, Disp: 3.5 g, Rfl: 0   ibuprofen (ADVIL,MOTRIN) 200 MG tablet, Take 200 mg by mouth every 6 (six) hours as needed., Disp: , Rfl:    Magnesium 400 MG CAPS, Take 1 capsule by mouth every evening., Disp: , Rfl:    Multiple Vitamin (MULTIVITAMIN) tablet, Take 1 tablet by mouth daily., Disp: , Rfl:    sertraline (ZOLOFT) 50 MG tablet, TAKE 1 TABLET DAILY (Patient taking differently: Take 75 mg by mouth daily.), Disp: 90 tablet, Rfl: 3  Current Facility-Administered Medications:    0.9 %  sodium chloride infusion, 500 mL, Intravenous, Once, Tomasa Rand, Dub Amis, MD   Review of Systems:    Review of Systems  Constitutional:  Negative for fever.  HENT:  Negative for sinus pain.   Eyes:  Positive for blurred vision, pain and discharge.  Respiratory:  Negative for cough.   Gastrointestinal:  Positive for diarrhea and nausea.  Skin:  Negative for itching.    Vitals:   Vitals:   09/10/23 1139 09/10/23 1221  BP: (!) 140/96 (!) 150/98  Pulse: 78   Temp: (!) 97.5 F (36.4 C)   TempSrc: Temporal   SpO2: 98%   Weight: 207 lb (93.9 kg)   Height: 6\' 1"  (1.854 m)      Body mass index is 27.31 kg/m.  Physical Exam:   Physical Exam Vitals and nursing note reviewed.  Constitutional:      General: He is not in acute distress.    Appearance: He is well-developed. He is not ill-appearing or toxic-appearing.  HENT:     Head: Normocephalic.  Eyes:     Conjunctiva/sclera: Conjunctivae normal.     Pupils: Pupils are equal, round, and reactive to light.     Right eye: Fluorescein uptake (very small amount at 8 o'clock) present.     Comments: Right upper lid with mild swelling  Cardiovascular:     Rate and Rhythm: Normal rate and regular rhythm.     Pulses: Normal pulses.     Heart sounds: Normal heart sounds, S1 normal and S2 normal.  Pulmonary:     Effort: Pulmonary effort is normal.     Breath sounds: Normal breath sounds.  Abdominal:     General: Abdomen is flat. Bowel sounds are increased.     Palpations: Abdomen is soft.     Tenderness: There is abdominal tenderness. There is no right CVA tenderness, left CVA tenderness, guarding or rebound.  Musculoskeletal:        General: Normal range of motion.     Cervical back: Normal range of motion.  Skin:    General: Skin is warm and dry.  Neurological:     Mental Status: He is alert and oriented to person, place, and time.     GCS: GCS eye subscore is 4. GCS verbal subscore is 5. GCS motor subscore is 6.  Psychiatric:        Speech: Speech normal.        Behavior: Behavior normal. Behavior is cooperative.         Thought Content: Thought content normal.        Judgment: Judgment normal.     Assessment and Plan:   Diarrhea, unspecified type Suspect viral illness Appears to be  well hydrating Continue adequate hydration, conservative use of imodium, bland diet Provided stool test to take home for collection if persists  Discomfort of right eye Possible small abrasion to eye with recent increase in crusting Will trial topical erythromycin ointment 2 times daily If no improvement, low threshold to refer to ophthalmology  Recommend preservative-free eye drops  Jarold Motto, PA-C  I,Safa M Kadhim,acting as a scribe for [employer], PA.,have documented all relevant documentation on the behalf of Jarold Motto, PA,as directed by  Jarold Motto, PA while in the presence of Jarold Motto, [place].   I, Jarold Motto, [place], have reviewed all documentation for this visit. The documentation on 09/10/23 for the exam, diagnosis, procedures, and orders are all accurate and complete.

## 2023-09-10 NOTE — Patient Instructions (Signed)
It was great to see you!  Apply ointment to eye twice daily   For eye drops, trial anything that says "Preservative-Free" such as Systane-brand drops. AVOID anything that says REDNESS RELIEF -- generally has some irritants  If no further improvement in diarrhea, please go ahead and do stool tests and return  Take care,  Jarold Motto PA-C

## 2023-09-11 ENCOUNTER — Other Ambulatory Visit: Payer: Managed Care, Other (non HMO)

## 2023-09-13 ENCOUNTER — Encounter: Payer: Self-pay | Admitting: Physician Assistant

## 2023-09-13 LAB — GI PROFILE, STOOL, PCR

## 2023-09-15 ENCOUNTER — Encounter: Payer: Self-pay | Admitting: Physician Assistant

## 2023-09-16 NOTE — Progress Notes (Signed)
Subjective:    Bernard Mora is a 48 y.o. male and is here for a comprehensive physical exam.  HPI  There are no preventive care reminders to display for this patient.  Acute Concerns: Leg Cramps  He reports occasionally experiencing leg cramps in the right leg, however, he has not experienced any symptoms for the past few days since increasing water intake and adjusting diet.  He's well hydrated throughout the day. He does report occasionally feeling a tingling sensation in his toes, but that is usual to him.  He's wondering if he need to take supplements  He's currently taking Centrum men multivitamin for few years.  He's wondering if he needs to take any magnesium supplements.    Swollen Eye  She still continues to complain of a swollen left eye that has persisted for the past week. He states that his symptoms are allergy like. He's been using OTC eye drop (preservative free). Drainage has resolved since last office visit.  He does report taking a n allergy relief daily.   Chronic Issues: HTN  Currently well controled with Amlodipine, 5 mg daily.  His at home BP reading typically range in around 130s. No complains or symptoms at this time. He does states he has an active lifestyle and wonders if that contributes to his higher BP readings.  Patient denies chest pain, SOB, blurred vision, dizziness, unusual headaches, lower leg swelling. Patient is compliant with medication. Denies excessive caffeine intake, stimulant usage, excessive alcohol intake, or increase in salt  Anxiety He reports compliance and tolerance of Zoloft 75 mg daily.  Well controlled. No complains or symptoms at this time.   Health Maintenance: Immunizations -- N/A Colonoscopy -- last done on 02-16-22. Polyp and internal hemorrhoids. Repeat in 3 years.  PSA --  Lab Results  Component Value Date   PSA 0.39 05/26/2018   PSA 0.52 05/27/2017   Diet -- Typically healthy, balanced food, reduced fast food  intake. Sleep habits -- no complains Exercise -- typically an active lifestyle.   Weight -- @FLOWAMB (14)@  Recent weight history Wt Readings from Last 10 Encounters:  09/17/23 206 lb 8 oz (93.7 kg)  09/10/23 207 lb (93.9 kg)  11/15/22 200 lb 8 oz (90.9 kg)  02/16/22 199 lb 12.8 oz (90.6 kg)  01/26/22 199 lb 12.8 oz (90.6 kg)  08/28/21 196 lb (88.9 kg)  08/09/21 198 lb 3.2 oz (89.9 kg)  04/18/21 185 lb (83.9 kg)  10/19/20 196 lb (88.9 kg)  05/29/19 185 lb (83.9 kg)   Body mass index is 27.24 kg/m.  Mood -- stable Alcohol use --  reports current alcohol use of about 4.0 - 5.0 standard drinks of alcohol per week.  Tobacco use --  Tobacco Use: Low Risk  (09/17/2023)   Patient History    Smoking Tobacco Use: Never    Smokeless Tobacco Use: Never    Passive Exposure: Not on file    Eligible for Low Dose CT? no  UTD with eye doctor? yes UTD with dentist? yes     09/17/2023    8:10 AM  Depression screen PHQ 2/9  Decreased Interest 0  Down, Depressed, Hopeless 0  PHQ - 2 Score 0  Altered sleeping 1  Tired, decreased energy 0  Change in appetite 0  Feeling bad or failure about yourself  0  Trouble concentrating 0  Moving slowly or fidgety/restless 0  Suicidal thoughts 0  PHQ-9 Score 1  Difficult doing work/chores Not difficult at all  Other providers/specialists: Patient Care Team: Jarold Motto, [place] as PCP - General (Physician Assistant)    PMHx, SurgHx, SocialHx, Medications, and Allergies were reviewed in the Visit Navigator and updated as appropriate.   Past Medical History:  Diagnosis Date   Allergy    Anxiety    Arthritis    GERD (gastroesophageal reflux disease)    Hyperlipidemia    Hypertension      Past Surgical History:  Procedure Laterality Date   arm fracture Left    crack ribs Right 07/30/2016   FRACTURE SURGERY Left 1993   wrist   TONSILLECTOMY       Family History  Problem Relation Age of Onset   Arthritis Mother     Diverticulitis Mother        did require colostomy   Cancer Father        don't know the type, possible kidney/lung and mets   Other Brother        skin lesions   Diabetes Paternal Grandfather    Colon cancer Neg Hx    Esophageal cancer Neg Hx    Rectal cancer Neg Hx    Stomach cancer Neg Hx     Social History   Tobacco Use   Smoking status: Never   Smokeless tobacco: Never  Vaping Use   Vaping status: Never Used  Substance Use Topics   Alcohol use: Yes    Alcohol/week: 4.0 - 5.0 standard drinks of alcohol    Types: 4 - 5 Cans of beer per week    Comment: daily beer and wine   Drug use: No    Review of Systems:   Review of Systems  Constitutional:  Negative for chills, fever, malaise/fatigue and weight loss.  HENT:  Negative for hearing loss, sinus pain and sore throat.   Eyes:  Positive for redness.       +swollen eyes  Respiratory:  Negative for cough and hemoptysis.   Cardiovascular:  Negative for chest pain, palpitations, leg swelling and PND.  Gastrointestinal:  Negative for abdominal pain, constipation, diarrhea, heartburn, nausea and vomiting.  Genitourinary:  Negative for dysuria, frequency and urgency.  Musculoskeletal:  Positive for joint pain (R leg). Negative for back pain, myalgias and neck pain.  Skin:  Negative for itching and rash.  Neurological:  Negative for dizziness, tingling, seizures and headaches.  Endo/Heme/Allergies:  Negative for polydipsia.  Psychiatric/Behavioral:  Negative for depression. The patient is not nervous/anxious.     Objective:    Vitals:   09/17/23 0806  BP: (!) 136/90  Pulse: 75  Temp: 97.9 F (36.6 C)  SpO2: 98%    Body mass index is 27.24 kg/m.  General  Alert, cooperative, no distress, appears stated age  Head:  Normocephalic, without obvious abnormality, atraumatic  Eyes:  PERRL, conjunctiva/corneas clear, EOM's intact, fundi benign, both eyes       Ears:  Normal TM's and external ear canals, both ears   Nose: Nares normal, septum midline, mucosa normal, no drainage or sinus tenderness  Throat: Lips, mucosa, and tongue normal; teeth and gums normal  Neck: Supple, symmetrical, trachea midline, no adenopathy;     thyroid:  No enlargement/tenderness/nodules; no carotid bruit or JVD  Back:   Symmetric, no curvature, ROM normal, no CVA tenderness  Lungs:   Clear to auscultation bilaterally, respirations unlabored  Chest wall:  No tenderness or deformity  Heart:  Regular rate and rhythm, S1 and S2 normal, no murmur, rub or gallop  Abdomen:  Soft, non-tender, bowel sounds active all four quadrants, no masses, no organomegaly  Extremities: Extremities normal, atraumatic, no cyanosis or edema  Prostate : Deferred   Skin: Skin color, texture, turgor normal, no rashes or lesions  Lymph nodes: Cervical, supraclavicular, and axillary nodes normal  Neurologic: CNII-XII grossly intact. Normal strength, sensation and reflexes throughout   AssessmentPlan:   Encounter for general adult medical examination with abnormal findings Today patient counseled on age appropriate routine health concerns for screening and prevention, each reviewed and up to date or declined. Immunizations reviewed and up to date or declined. Labs ordered and reviewed. Risk factors for depression reviewed and negative. Hearing function and visual acuity are intact. ADLs screened and addressed as needed. Functional ability and level of safety reviewed and appropriate. Education, counseling and referrals performed based on assessed risks today. Patient provided with a copy of personalized plan for preventive services.  Discomfort of right eye Refer to ophthalmology for further evaluation  Primary hypertension Above goal today No evidence of end-organ damage on my exam Recommend patient monitor home blood pressure at least a few times weekly Continue amlodipine  mg daily If home monitoring shows consistent elevation, or any  symptom(s) develop, recommend reach out to [place] for further advice on next steps Follow-up in 1 year, sooner if concerns  Elevated hemoglobin A1c Update A1c and provide recommendations  Hyperlipidemia, unspecified hyperlipidemia type Update lipid panel and provide recommendations  Overweight Continue efforts at healthy lifestyle  Anxiety Well controlled Continue Zoloft 75 mg daily Follow-up in 1 year, sooner if concerns  Leg cramps Possibly related to excessive driving Continue to monitor and consider sending to sports medicine vs physical therapy if persists No red flag symptom(s)     Jarold Motto, PA-C [facility] M Kadhim,acting as a Neurosurgeon for [employer], PA.,have documented all relevant documentation on the behalf of Jarold Motto, PA,as directed by  Jarold Motto, PA while in the presence of Jarold Motto, [place].   I, Jarold Motto, [place], have reviewed all documentation for this visit. The documentation on 09/17/23 for the exam, diagnosis, procedures, and orders are all accurate and complete.

## 2023-09-17 ENCOUNTER — Encounter: Payer: Self-pay | Admitting: Physician Assistant

## 2023-09-17 ENCOUNTER — Ambulatory Visit (INDEPENDENT_AMBULATORY_CARE_PROVIDER_SITE_OTHER): Payer: Managed Care, Other (non HMO) | Admitting: Physician Assistant

## 2023-09-17 VITALS — BP 136/90 | HR 75 | Temp 97.9°F | Ht 73.0 in | Wt 206.5 lb

## 2023-09-17 DIAGNOSIS — I10 Essential (primary) hypertension: Secondary | ICD-10-CM | POA: Diagnosis not present

## 2023-09-17 DIAGNOSIS — Z0001 Encounter for general adult medical examination with abnormal findings: Secondary | ICD-10-CM | POA: Diagnosis not present

## 2023-09-17 DIAGNOSIS — R252 Cramp and spasm: Secondary | ICD-10-CM

## 2023-09-17 DIAGNOSIS — E663 Overweight: Secondary | ICD-10-CM

## 2023-09-17 DIAGNOSIS — R7309 Other abnormal glucose: Secondary | ICD-10-CM | POA: Diagnosis not present

## 2023-09-17 DIAGNOSIS — E785 Hyperlipidemia, unspecified: Secondary | ICD-10-CM

## 2023-09-17 DIAGNOSIS — F419 Anxiety disorder, unspecified: Secondary | ICD-10-CM

## 2023-09-17 DIAGNOSIS — H5711 Ocular pain, right eye: Secondary | ICD-10-CM | POA: Diagnosis not present

## 2023-09-17 LAB — COMPREHENSIVE METABOLIC PANEL
ALT: 26 U/L (ref 0–53)
AST: 23 U/L (ref 0–37)
Albumin: 4.4 g/dL (ref 3.5–5.2)
Alkaline Phosphatase: 48 U/L (ref 39–117)
BUN: 10 mg/dL (ref 6–23)
CO2: 31 meq/L (ref 19–32)
Calcium: 8.8 mg/dL (ref 8.4–10.5)
Chloride: 101 meq/L (ref 96–112)
Creatinine, Ser: 0.97 mg/dL (ref 0.40–1.50)
GFR: 92.98 mL/min (ref >60.00)
Glucose, Bld: 106 mg/dL — ABNORMAL HIGH (ref 70–99)
Potassium: 4.1 meq/L (ref 3.5–5.1)
Sodium: 139 meq/L (ref 135–145)
Total Bilirubin: 0.3 mg/dL (ref 0.2–1.2)
Total Protein: 6.8 g/dL (ref 6.0–8.3)

## 2023-09-17 LAB — LIPID PANEL
Cholesterol: 202 mg/dL — ABNORMAL HIGH (ref 0–200)
HDL: 64.9 mg/dL (ref >39.00)
LDL Cholesterol: 120 mg/dL — ABNORMAL HIGH (ref 0–99)
NonHDL: 137.16
Total CHOL/HDL Ratio: 3
Triglycerides: 85 mg/dL (ref 0.0–149.0)
VLDL: 17 mg/dL (ref 0.0–40.0)

## 2023-09-17 LAB — CBC WITH DIFFERENTIAL/PLATELET
Basophils Absolute: 0 10*3/uL (ref 0.0–0.1)
Basophils Relative: 0.6 % (ref 0.0–3.0)
Eosinophils Absolute: 0.1 10*3/uL (ref 0.0–0.7)
Eosinophils Relative: 1.5 % (ref 0.0–5.0)
HCT: 43.9 % (ref 39.0–52.0)
Hemoglobin: 14.8 g/dL (ref 13.0–17.0)
Lymphocytes Relative: 37 % (ref 12.0–46.0)
Lymphs Abs: 1.9 10*3/uL (ref 0.7–4.0)
MCHC: 33.7 g/dL (ref 30.0–36.0)
MCV: 85.1 fL (ref 78.0–100.0)
Monocytes Absolute: 0.4 10*3/uL (ref 0.1–1.0)
Monocytes Relative: 7.4 % (ref 3.0–12.0)
Neutro Abs: 2.7 10*3/uL (ref 1.4–7.7)
Neutrophils Relative %: 53.5 % (ref 43.0–77.0)
Platelets: 282 10*3/uL (ref 150.0–400.0)
RBC: 5.16 Mil/uL (ref 4.22–5.81)
RDW: 13.4 % (ref 11.5–15.5)
WBC: 5.1 10*3/uL (ref 4.0–10.5)

## 2023-09-17 LAB — HEMOGLOBIN A1C: Hgb A1c MFr Bld: 6 % (ref 4.6–6.5)

## 2023-09-17 NOTE — Patient Instructions (Addendum)
It was great to see you!  Please go to the lab for blood work.   Our office will call you with your results unless you have chosen to receive results via MyChart.  If your blood work is normal we will follow-up each year for physicals and as scheduled for chronic medical problems.  If anything is abnormal we will treat accordingly and get you in for a follow-up.  Take care,  Lelon Mast

## 2023-09-18 ENCOUNTER — Encounter: Payer: Self-pay | Admitting: Physician Assistant

## 2023-09-19 ENCOUNTER — Encounter: Payer: Self-pay | Admitting: Physician Assistant

## 2023-09-20 ENCOUNTER — Telehealth: Payer: Self-pay | Admitting: Physician Assistant

## 2023-09-20 ENCOUNTER — Other Ambulatory Visit: Payer: Self-pay | Admitting: Physician Assistant

## 2023-09-20 DIAGNOSIS — R252 Cramp and spasm: Secondary | ICD-10-CM

## 2023-09-20 NOTE — Telephone Encounter (Signed)
PT had labs done on 09/17/23 but Magnesium order was not put in. LVM to schedule PT to schedule lab appt if PT still wants to have this done

## 2023-09-23 ENCOUNTER — Other Ambulatory Visit: Payer: Self-pay | Admitting: Physician Assistant

## 2023-09-23 ENCOUNTER — Other Ambulatory Visit (INDEPENDENT_AMBULATORY_CARE_PROVIDER_SITE_OTHER): Payer: Managed Care, Other (non HMO)

## 2023-09-23 ENCOUNTER — Encounter: Payer: Self-pay | Admitting: Physician Assistant

## 2023-09-23 DIAGNOSIS — R252 Cramp and spasm: Secondary | ICD-10-CM

## 2023-09-23 LAB — MAGNESIUM: Magnesium: 1.9 mg/dL (ref 1.5–2.5)

## 2023-10-05 ENCOUNTER — Encounter: Payer: Self-pay | Admitting: Physician Assistant

## 2023-10-07 ENCOUNTER — Other Ambulatory Visit: Payer: Self-pay | Admitting: Physician Assistant

## 2023-10-07 DIAGNOSIS — R1084 Generalized abdominal pain: Secondary | ICD-10-CM

## 2023-11-19 ENCOUNTER — Encounter: Payer: Self-pay | Admitting: Physician Assistant

## 2023-11-19 MED ORDER — SERTRALINE HCL 50 MG PO TABS: 75.0000 mg | ORAL_TABLET | Freq: Every day | ORAL | 1 refills | Status: DC

## 2023-11-19 MED ORDER — AMLODIPINE BESYLATE 5 MG PO TABS: 5.0000 mg | ORAL_TABLET | Freq: Every day | ORAL | 3 refills | Status: DC

## 2023-11-19 NOTE — Addendum Note (Signed)
 Addended by: Jimmye Norman on: 11/19/2023 11:00 AM   Modules accepted: Orders

## 2023-11-19 NOTE — Addendum Note (Signed)
 Addended by: Jimmye Norman on: 11/19/2023 02:17 PM   Modules accepted: Orders

## 2023-12-10 NOTE — Progress Notes (Signed)
 "  Chief Complaint: Altered bowel habits Primary GI MD: Dr. Stacia  HPI: Discussed the use of AI scribe software for clinical note transcription with the patient, who gave verbal consent to proceed.  History of Present Illness Shown Bernard Mora is a 48 year old male with a history of gastrointestinal issues who presents with chronic altered bowel habits and bloating  He has a long-standing history of gastrointestinal issues, which he attributes to a family history of upset stomachs and fast metabolisms. He experiences frequent diarrhea, particularly in the afternoons, and has made dietary changes over the years to manage these symptoms. Despite these efforts, there has been an increase in stomach sensitivity to foods over the past year.  In January, he experienced a six-day episode of severe gastrointestinal distress due to an astrovirus, which caused symptoms similar to colonoscopy preparation. This was followed by a rare occurrence of a bowel blockage after consuming yogurt, which he had avoided for two years due to dairy intolerance. The blockage/constipation episode resolved after four days, during which he experienced severe cramping and eventually had multiple bowel movements in one day.  He experiences significant bloating and gas, despite eating a clean diet focused on salads, vegetables, and small portions of meat. He drinks a lot of water and sugar-free Gatorades for electrolytes.   He reports occasional reflux and regurgitation, particularly after consuming acidic foods and caffeine. He drinks two to three cups of coffee daily, which he suspects may contribute to his symptoms as well as his intermittently loose stools. He has noticed weight gain over the past year, which he attributes to decreased physical activity and possibly age-related metabolic changes.   PREVIOUS GI WORKUP   Colonoscopy for screening 01/2022 - Fair prep - 5 mm polyp (tubular adenoma) in ascending colon, removed  with cold snare.  Resected and retrieved. - Examined portion of ileum normal - Nonbleeding internal hemorrhoids - Repeat 3 years (01/2025) with 2-day prep  Past Medical History:  Diagnosis Date   Allergy    Anxiety    Arthritis    GERD (gastroesophageal reflux disease)    Hyperlipidemia    Hypertension     Past Surgical History:  Procedure Laterality Date   arm fracture Left    crack ribs Right 07/30/2016   FRACTURE SURGERY Left 1993   wrist   TONSILLECTOMY      Current Outpatient Medications  Medication Sig Dispense Refill   amLODipine  (NORVASC ) 5 MG tablet Take 1 tablet (5 mg total) by mouth daily. 90 tablet 3   ibuprofen (ADVIL,MOTRIN) 200 MG tablet Take 200-600 mg by mouth as needed.     Multiple Vitamin (MULTIVITAMIN) tablet Take 1 tablet by mouth as needed.     sertraline  (ZOLOFT ) 50 MG tablet Take 1.5 tablets (75 mg total) by mouth daily. 180 tablet 1   calcium citrate-vitamin D (CITRACAL+D) 315-200 MG-UNIT tablet Take 1 tablet by mouth 2 (two) times daily.     Current Facility-Administered Medications  Medication Dose Route Frequency Provider Last Rate Last Admin   0.9 %  sodium chloride  infusion  500 mL Intravenous Once Cunningham, Scott E, MD        Allergies as of 12/11/2023   (No Known Allergies)    Family History  Problem Relation Age of Onset   Arthritis Mother    Diverticulitis Mother        did require colostomy   Cancer Father        don't know the type, possible kidney/lung and mets  Other Brother        skin lesions   Diabetes Paternal Grandfather    Colon cancer Neg Hx    Esophageal cancer Neg Hx    Rectal cancer Neg Hx    Stomach cancer Neg Hx     Social History   Socioeconomic History   Marital status: Married    Spouse name: Not on file   Number of children: Not on file   Years of education: Not on file   Highest education level: Not on file  Occupational History   Not on file  Tobacco Use   Smoking status: Never   Smokeless  tobacco: Never  Vaping Use   Vaping status: Never Used  Substance and Sexual Activity   Alcohol  use: Yes    Alcohol /week: 4.0 - 5.0 standard drinks of alcohol     Types: 4 - 5 Cans of beer per week    Comment: daily beer and wine   Drug use: No   Sexual activity: Yes    Partners: Female  Other Topics Concern   Not on file  Social History Narrative   Live in [place]; originally from [place]   Works in corporate treasurer   Married, two children 2.5 and 8   Hobbies: spending time with kids, sports, martial arts, cars   Social Drivers of Corporate Investment Banker Strain: Not on file  Food Insecurity: Not on file  Transportation Needs: Not on file  Physical Activity: Not on file  Stress: Not on file  Social Connections: Not on file  Intimate Partner Violence: Not on file    Review of Systems:    Constitutional: No weight loss, fever, chills, weakness or fatigue HEENT: Eyes: No change in vision               Ears, Nose, Throat:  No change in hearing or congestion Skin: No rash or itching Cardiovascular: No chest pain, chest pressure or palpitations   Respiratory: No SOB or cough Gastrointestinal: See HPI and otherwise negative Genitourinary: No dysuria or change in urinary frequency Neurological: No headache, dizziness or syncope Musculoskeletal: No new muscle or joint pain Hematologic: No bleeding or bruising Psychiatric: No history of depression or anxiety    Physical Exam:  Vital signs: BP 120/76 (BP Location: Left Arm, Patient Position: Sitting, Cuff Size: Normal)   Pulse 76   Ht 6' (1.829 m) Comment: height measured without shoes  Wt 209 lb 6 oz (95 kg)   BMI 28.40 kg/m   Constitutional: NAD, alert and cooperative Head:  Normocephalic and atraumatic. Eyes:   PEERL, EOMI. No icterus. Conjunctiva pink. Respiratory: Respirations even and unlabored. Lungs clear to auscultation bilaterally.   No wheezes, crackles, or rhonchi.  Cardiovascular:  Regular rate and  rhythm. No peripheral edema, cyanosis or pallor.  Gastrointestinal:  Soft, nondistended, nontender. No rebound or guarding. Normal bowel sounds. No appreciable masses or hepatomegaly. Rectal:  Declines Msk:  Symmetrical without gross deformities. Without edema, no deformity or joint abnormality.  Neurologic:  Alert and  oriented x4;  grossly normal neurologically.  Skin:   Dry and intact without significant lesions or rashes. Psychiatric: Oriented to person, place and time. Demonstrates good judgement and reason without abnormal affect or behaviors.   RELEVANT LABS AND IMAGING: CBC    Component Value Date/Time   WBC 5.1 09/17/2023 0839   RBC 5.16 09/17/2023 0839   HGB 14.8 09/17/2023 0839   HCT 43.9 09/17/2023 0839   PLT 282.0 09/17/2023  0839   MCV 85.1 09/17/2023 0839   MCH 28.5 04/18/2021 0846   MCHC 33.7 09/17/2023 0839   RDW 13.4 09/17/2023 0839   LYMPHSABS 1.9 09/17/2023 0839   MONOABS 0.4 09/17/2023 0839   EOSABS 0.1 09/17/2023 0839   BASOSABS 0.0 09/17/2023 0839    CMP     Component Value Date/Time   NA 139 09/17/2023 0839   K 4.1 09/17/2023 0839   CL 101 09/17/2023 0839   CO2 31 09/17/2023 0839   GLUCOSE 106 (H) 09/17/2023 0839   BUN 10 09/17/2023 0839   CREATININE 0.97 09/17/2023 0839   CALCIUM 8.8 09/17/2023 0839   PROT 6.8 09/17/2023 0839   ALBUMIN 4.4 09/17/2023 0839   AST 23 09/17/2023 0839   ALT 26 09/17/2023 0839   ALKPHOS 48 09/17/2023 0839   BILITOT 0.3 09/17/2023 0839   GFRNONAA >60 04/18/2021 0846     Assessment/Plan:   Assessment & Plan  GERD Intermittent reflux and regurgitation, likely exacerbated by dietary habits. Prefers lifestyle modifications. - Provide GERD diet handout. - Advise to avoid eating before lying down. - Recommend reducing caffeine intake, especially coffee. - Advise to avoid acidic foods such as tomatoes and wine.  Chronic alternating bowel habits Up-to-date on colonoscopy June 2023.  Mainly diet specific with some  improvement after cutting out dairy but continuing to have certain food sensitivities which she cannot pinpoint.  Alternates between loose stools and formed stools with occasional bloating/gassiness.  Upon further description of his diet it appears that diet is high in FODMAPs.  This could be causing some of his issue and he may not be obtaining enough fiber throughout the day.  Suspect his episode of constipation after astrovirus was secondary to postinfectious IBS - Recommend Benefiber in the mornings to help regulate bowel movements - Advise trial of decaf coffee (currently drinking 3-4 cups per day). - Provide low FODMAP diet handout. -Follow-up 8 weeks  History of colon polyps Colonoscopy June 2023 with 5 mm tubular adenoma and fair prep.  Recall 3 years 2-day prep due to suboptimal bowel prep. - Recall June 2026   This visit required 35 minutes of patient care (this includes precharting, chart review, review of results, face-to-face time used for counseling as well as treatment plan and follow-up. The patient was provided an opportunity to ask questions and all were answered. The patient agreed with the plan and demonstrated an understanding of the instructions.   Nestor Mollie RIGGERS [facility] 12/11/2023, 11:05 AM  Cc: Job Lukes, [place] "

## 2023-12-11 ENCOUNTER — Encounter: Payer: Self-pay | Admitting: Gastroenterology

## 2023-12-11 ENCOUNTER — Ambulatory Visit: Admitting: Gastroenterology

## 2023-12-11 VITALS — BP 120/76 | HR 76 | Ht 72.0 in | Wt 209.4 lb

## 2023-12-11 DIAGNOSIS — K219 Gastro-esophageal reflux disease without esophagitis: Secondary | ICD-10-CM

## 2023-12-11 DIAGNOSIS — R140 Abdominal distension (gaseous): Secondary | ICD-10-CM

## 2023-12-11 DIAGNOSIS — Z860101 Personal history of adenomatous and serrated colon polyps: Secondary | ICD-10-CM

## 2023-12-11 DIAGNOSIS — R194 Change in bowel habit: Secondary | ICD-10-CM

## 2023-12-11 NOTE — Patient Instructions (Signed)
 Please follow up sooner if symptoms increase or worsen  Due to recent changes in healthcare laws, you may see the results of your imaging and laboratory studies on MyChart before your provider has had a chance to review them.  We understand that in some cases there may be results that are confusing or concerning to you. Not all laboratory results come back in the same time frame and the provider may be waiting for multiple results in order to interpret others.  Please give us  48 hours in order for your provider to thoroughly review all the results before contacting the office for clarification of your results.   _______________________________________________________  If your blood pressure at your visit was 140/90 or greater, please contact your primary care physician to follow up on this.  _______________________________________________________  If you are age 41 or older, your body mass index should be between 23-30. Your Body mass index is 28.4 kg/m. If this is out of the aforementioned range listed, please consider follow up with your Primary Care Provider.  If you are age 75 or younger, your body mass index should be between 19-25. Your Body mass index is 28.4 kg/m. If this is out of the aformentioned range listed, please consider follow up with your Primary Care Provider.   ________________________________________________________  The [facility] providers would like to encourage you to use MYCHART to communicate with providers for non-urgent requests or questions.  Due to long hold times on the telephone, sending your provider a message by [facility] may be a faster and more efficient way to get a response.  Please allow 48 business hours for a response.  Please remember that this is for non-urgent requests.  _______________________________________________________ Thank you for trusting me with your gastrointestinal care!   Suzanna Erp, PA

## 2023-12-18 NOTE — Progress Notes (Signed)
 Agree with the assessment and plan as outlined by Boone Master, PA-C.

## 2024-01-02 ENCOUNTER — Other Ambulatory Visit (HOSPITAL_COMMUNITY): Payer: Self-pay

## 2024-01-29 ENCOUNTER — Ambulatory Visit: Admitting: Gastroenterology

## 2024-03-12 ENCOUNTER — Encounter: Payer: Self-pay | Admitting: Physician Assistant

## 2024-03-12 DIAGNOSIS — D229 Melanocytic nevi, unspecified: Secondary | ICD-10-CM

## 2024-03-18 ENCOUNTER — Ambulatory Visit: Admitting: Dermatology

## 2024-03-18 ENCOUNTER — Encounter: Payer: Self-pay | Admitting: Dermatology

## 2024-03-18 VITALS — BP 130/85 | HR 76

## 2024-03-18 DIAGNOSIS — L821 Other seborrheic keratosis: Secondary | ICD-10-CM | POA: Diagnosis not present

## 2024-03-18 DIAGNOSIS — D1801 Hemangioma of skin and subcutaneous tissue: Secondary | ICD-10-CM

## 2024-03-18 DIAGNOSIS — Z1283 Encounter for screening for malignant neoplasm of skin: Secondary | ICD-10-CM

## 2024-03-18 DIAGNOSIS — D229 Melanocytic nevi, unspecified: Secondary | ICD-10-CM

## 2024-03-18 DIAGNOSIS — Z808 Family history of malignant neoplasm of other organs or systems: Secondary | ICD-10-CM

## 2024-03-18 DIAGNOSIS — L814 Other melanin hyperpigmentation: Secondary | ICD-10-CM | POA: Diagnosis not present

## 2024-03-18 DIAGNOSIS — D492 Neoplasm of unspecified behavior of bone, soft tissue, and skin: Secondary | ICD-10-CM | POA: Diagnosis not present

## 2024-03-18 DIAGNOSIS — W908XXA Exposure to other nonionizing radiation, initial encounter: Secondary | ICD-10-CM

## 2024-03-18 DIAGNOSIS — D485 Neoplasm of uncertain behavior of skin: Secondary | ICD-10-CM

## 2024-03-18 DIAGNOSIS — L578 Other skin changes due to chronic exposure to nonionizing radiation: Secondary | ICD-10-CM

## 2024-03-18 DIAGNOSIS — D2262 Melanocytic nevi of left upper limb, including shoulder: Secondary | ICD-10-CM | POA: Insufficient documentation

## 2024-03-18 NOTE — Progress Notes (Signed)
   New Patient Visit   Subjective  Bernard Mora is a 48 y.o. male who presents for the following:  Total Body Skin Exam (TBSE)  Patient present today for new patient visit for TBSE.The patient denies he  has spots, moles and lesions to be evaluated, some may be new or changing and the patient may have concern these could be cancer. Patient has previously been treated by a dermatologist.Patient reports he  does not have hx of bx. Patient reports family history of skin cancers. Patient reports throughout his lifetime has had moderate sun exposure. Currently, patient reports if he  has excessive sun exposure, he  does apply sunscreen and/or wears protective coverings.  The following portions of the chart were reviewed this encounter and updated as appropriate: medications, allergies, medical history  Review of Systems:  No other skin or systemic complaints except as noted in HPI or Assessment and Plan.  Objective  Well appearing patient in no apparent distress; mood and affect are within normal limits.  A full examination was performed including scalp, head, eyes, ears, nose, lips, neck, chest, axillae, abdomen, back, buttocks, bilateral upper extremities, bilateral lower extremities, hands, feet, fingers, toes, fingernails, and toenails. All findings within normal limits unless otherwise noted below.   Relevant exam findings are noted in the Assessment and Plan.      Right Medial Lower Leg 8 mm red papule with central crust irritated cherry Keratoacanthoma   Assessment & Plan   LENTIGINES, SEBORRHEIC KERATOSES, HEMANGIOMAS - Benign normal skin lesions - Benign-appearing - Call for any changes  MELANOCYTIC NEVI - Tan-brown and/or pink-flesh-colored symmetric macules and papules - Benign appearing on exam today - Observation - Call clinic for new or changing moles - Recommend daily use of broad spectrum spf 30+ sunscreen to sun-exposed areas.   MILD ACTINIC DAMAGE - Chronic condition,  secondary to cumulative UV/sun exposure - diffuse scaly erythematous macules with underlying dyspigmentation - Recommend daily broad spectrum sunscreen SPF 30+ to sun-exposed areas, reapply every 2 hours as needed.  - Staying in the shade or wearing long sleeves, sun glasses (UVA+UVB protection) and wide brim hats (4-inch brim around the entire circumference of the hat) are also recommended for sun protection.  - Call for new or changing lesions.  SKIN CANCER SCREENING PERFORMED TODAY    Return in about 1 year (around 03/18/2025) for TBSE.  I, Jetta Ager, am acting as Neurosurgeon for [employer], DO.  Documentation: I have reviewed the above documentation for accuracy and completeness, and I agree with the above.  Delon Lenis, DO

## 2024-03-18 NOTE — Patient Instructions (Addendum)

## 2024-03-20 LAB — SURGICAL PATHOLOGY

## 2024-03-23 ENCOUNTER — Ambulatory Visit: Payer: Self-pay | Admitting: Dermatology

## 2024-04-22 ENCOUNTER — Encounter: Payer: Self-pay | Admitting: Physician Assistant

## 2024-05-05 ENCOUNTER — Encounter: Payer: Self-pay | Admitting: Physician Assistant

## 2024-05-05 DIAGNOSIS — R0981 Nasal congestion: Secondary | ICD-10-CM

## 2024-05-07 ENCOUNTER — Encounter (INDEPENDENT_AMBULATORY_CARE_PROVIDER_SITE_OTHER): Payer: Self-pay | Admitting: Otolaryngology

## 2024-05-07 ENCOUNTER — Encounter: Payer: Self-pay | Admitting: Physician Assistant

## 2024-05-07 ENCOUNTER — Ambulatory Visit (INDEPENDENT_AMBULATORY_CARE_PROVIDER_SITE_OTHER): Admitting: Otolaryngology

## 2024-05-07 ENCOUNTER — Ambulatory Visit: Admitting: Physician Assistant

## 2024-05-07 VITALS — BP 130/88 | HR 91 | Temp 97.5°F | Ht 72.0 in | Wt 207.2 lb

## 2024-05-07 VITALS — BP 135/85 | HR 76 | Ht 72.0 in | Wt 205.0 lb

## 2024-05-07 DIAGNOSIS — R0981 Nasal congestion: Secondary | ICD-10-CM | POA: Diagnosis not present

## 2024-05-07 DIAGNOSIS — I10 Essential (primary) hypertension: Secondary | ICD-10-CM

## 2024-05-07 DIAGNOSIS — J342 Deviated nasal septum: Secondary | ICD-10-CM

## 2024-05-07 DIAGNOSIS — J3489 Other specified disorders of nose and nasal sinuses: Secondary | ICD-10-CM | POA: Diagnosis not present

## 2024-05-07 DIAGNOSIS — R040 Epistaxis: Secondary | ICD-10-CM | POA: Diagnosis not present

## 2024-05-07 DIAGNOSIS — F419 Anxiety disorder, unspecified: Secondary | ICD-10-CM | POA: Diagnosis not present

## 2024-05-07 DIAGNOSIS — J3089 Other allergic rhinitis: Secondary | ICD-10-CM

## 2024-05-07 DIAGNOSIS — H6993 Unspecified Eustachian tube disorder, bilateral: Secondary | ICD-10-CM

## 2024-05-07 DIAGNOSIS — Z789 Other specified health status: Secondary | ICD-10-CM | POA: Diagnosis not present

## 2024-05-07 DIAGNOSIS — J343 Hypertrophy of nasal turbinates: Secondary | ICD-10-CM

## 2024-05-07 MED ORDER — AZELASTINE HCL 0.1 % NA SOLN: 2.0000 | Freq: Two times a day (BID) | NASAL | 12 refills | Status: DC

## 2024-05-07 MED ORDER — FLONASE SENSIMIST 27.5 MCG/SPRAY NA SUSP: 2.0000 | Freq: Two times a day (BID) | NASAL | 12 refills | Status: DC

## 2024-05-07 MED ORDER — AMLODIPINE BESYLATE 2.5 MG PO TABS: 2.5000 mg | ORAL_TABLET | Freq: Every day | ORAL | 1 refills | Status: DC

## 2024-05-07 NOTE — Patient Instructions (Addendum)
 Flonase  sensimist - two sprays each nostril twice per day; right after, use astelin  nasal spray two sprays each nostril twice per day  Ayr gel: Apply a pea sized amount up to 4 times daily just to inside of each nostril like above, then pinch your nose for 10 seconds. Use this consistently.       Aureliano Med Nasal Saline Rinse  - start nasal saline rinses with NeilMed Bottle available over the counter    Nasal Saline Irrigation instructions: If you choose to make your own salt water solution, You will need: Salt (kosher, canning, or pickling salt) Baking soda Nasal irrigation bottle (i.e. Aureliano Med Sinus Rinse) Measuring spoon ( teaspoon) Distilled / boiled water   Mix solution Mix 1 teaspoon of salt, 1/2 teaspoon of baking soda and 1 cup of water into irrigation bottle ** May use saline packet instead of homemade recipe for this step if you prefer If medicine was prescribed to be mixed with solution, place this into bottle Examples 2 inches of 2% mupirocin ointment Budesonide solution Position your head: Lean over sink (about 45 degrees) Rotate head (about 45 degrees) so that one nostril is above the other Irrigate Insert tip of irrigation bottle into upper nostril so it forms a comfortable seal Irrigate while breathing through your mouth May remove the straw from the bottle in order to irrigate the entire solution (important if medicine was added) Exhale through nose when finished and blow nose as necessary  Repeat on opposite side with other 1/2 of solution (120 mL) or remake solution if all 240 mL was used on first side Wash irrigation bottle regularly, replace every 3 months

## 2024-05-07 NOTE — Progress Notes (Signed)
 Bernard Mora is a 48 y.o. male here for a follow up of a pre-existing problem.  History of Present Illness:   Chief Complaint  Patient presents with   Sinus Problem    Pt would like to discuss. ENT referral was placed.   Medication Problem    Pt would like to discuss Zoloft .    Discussed the use of AI scribe software for clinical note transcription with the patient, who gave verbal consent to proceed.  History of Present Illness Bernard Mora is a 48 year old male who presents with concerns about Zoloft  side effects and recent nosebleeds.  He experiences significant fatigue and drowsiness in the afternoons, which he associates with his morning dose of Zoloft . He reduced his Zoloft  dose from 75 mg to 50 mg, improving his mood and reducing anxiety and irritability. He notes ongoing sweating and weight gain, with a weight gain of about a pound a month over the last year. He reports a lack of motivation and difficulty getting out of bed, which improved after reducing his Zoloft  dose.  He has recurrent epistaxis over the past month, with irritation in his left nostril leading to bleeding. He suspects dryness from allergy medication may contribute to this issue. He has a history of allergies, causing congestion and increased sneezing. He stopped taking allergy pills due to concerns about dryness and uses saline spray three times a day, which helps with nasal dryness.  He experiences internal tremors, hot flashes, and chills, particularly after reducing his Zoloft  dose to 25 mg. He describes feeling 'hot and flustered' with sweating and chills, and notes a pressure headache and speech issues on a particularly bad day. He also experiences dizziness and motion sickness, especially while driving.  He has hypertension, managed with blood pressure medication, amlodipine  5 mg daily with home readings ranging from 124/75 to 132/82. He consumes about four beers a day and is working on reducing his intake. He  experiences fluid in his ears, causing lightheadedness, and attributes some symptoms to sinus issues, using saline spray to manage nasal congestion.    Past Medical History:  Diagnosis Date   Allergy    Anxiety    Arthritis    GERD (gastroesophageal reflux disease)    Hyperlipidemia    Hypertension      Social History   Tobacco Use   Smoking status: Never    Passive exposure: Never   Smokeless tobacco: Never  Vaping Use   Vaping status: Never Used  Substance Use Topics   Alcohol  use: Yes    Alcohol /week: 4.0 - 5.0 standard drinks of alcohol     Types: 4 - 5 Cans of beer per week    Comment: daily beer and wine   Drug use: No    Past Surgical History:  Procedure Laterality Date   arm fracture Left    crack ribs Right 07/30/2016   FRACTURE SURGERY Left 1993   wrist   TONSILLECTOMY      Family History  Problem Relation Age of Onset   Arthritis Mother    Diverticulitis Mother        did require colostomy   Cancer Father        don't know the type, possible kidney/lung and mets   Other Brother        skin lesions   Diabetes Paternal Grandfather    Colon cancer Neg Hx    Esophageal cancer Neg Hx    Rectal cancer Neg Hx    Stomach cancer  Neg Hx     No Known Allergies  Current Medications:   Current Outpatient Medications:    amLODipine  (NORVASC ) 5 MG tablet, Take 1 tablet (5 mg total) by mouth daily., Disp: 90 tablet, Rfl: 3   ibuprofen (ADVIL,MOTRIN) 200 MG tablet, Take 200-600 mg by mouth as needed., Disp: , Rfl:    Multiple Vitamin (MULTIVITAMIN) tablet, Take 1 tablet by mouth as needed., Disp: , Rfl:    sertraline  (ZOLOFT ) 50 MG tablet, Take 1.5 tablets (75 mg total) by mouth daily. (Patient taking differently: Take 25 mg by mouth daily.), Disp: 180 tablet, Rfl: 1  Current Facility-Administered Medications:    0.9 %  sodium chloride  infusion, 500 mL, Intravenous, Once, Stacia Glendia BRAVO, MD   Review of Systems:   Negative unless otherwise specified  per HPI.  Vitals:   Vitals:   05/07/24 0939  BP: 130/88  Pulse: 91  Temp: (!) 97.5 F (36.4 C)  TempSrc: Temporal  SpO2: 96%  Weight: 207 lb 4 oz (94 kg)  Height: 6' (1.829 m)     Body mass index is 28.11 kg/m.  Physical Exam:   Physical Exam Vitals and nursing note reviewed.  Constitutional:      General: He is not in acute distress.    Appearance: He is well-developed. He is not ill-appearing or toxic-appearing.  HENT:     Head: Normocephalic and atraumatic.     Right Ear: Ear canal and external ear normal. A middle ear effusion is present. Tympanic membrane is not erythematous, retracted or bulging.     Left Ear: Ear canal and external ear normal. A middle ear effusion is present. Tympanic membrane is not erythematous, retracted or bulging.     Nose: Nose normal.     Right Sinus: No maxillary sinus tenderness or frontal sinus tenderness.     Left Sinus: No maxillary sinus tenderness or frontal sinus tenderness.     Comments: Clotted blood on medial aspect of left nostril    Mouth/Throat:     Pharynx: Uvula midline. No posterior oropharyngeal erythema.  Eyes:     General: Lids are normal.     Conjunctiva/sclera: Conjunctivae normal.  Neck:     Trachea: Trachea normal.  Cardiovascular:     Rate and Rhythm: Normal rate and regular rhythm.     Pulses: Normal pulses.     Heart sounds: Normal heart sounds, S1 normal and S2 normal.  Pulmonary:     Effort: Pulmonary effort is normal.     Breath sounds: Normal breath sounds. No decreased breath sounds, wheezing, rhonchi or rales.  Lymphadenopathy:     Cervical: No cervical adenopathy.  Skin:    General: Skin is warm and dry.  Neurological:     Mental Status: He is alert.     GCS: GCS eye subscore is 4. GCS verbal subscore is 5. GCS motor subscore is 6.  Psychiatric:        Speech: Speech normal.        Behavior: Behavior normal. Behavior is cooperative.     Assessment and Plan:   Assessment and Plan Assessment  & Plan Anxiety Experiencing Zoloft  side effects and withdrawal symptoms. Mood and energy improved with dose reduction. - Continue Zoloft  25 mg at night. - Consider tapering Zoloft  to 12.5 mg in two weeks if symptoms stabilize. - May continue to wean medication and stop if desired - Monitor for withdrawal symptoms and mood changes.  Primary hypertension Blood pressure well-controlled on Amlodipine  5 mg. Considering  dose reduction due to good control. - Reduce Amlodipine  to 2.5 mg daily. - Monitor blood pressure two to three times a week. - Report back if elevated readings consistently > 130/80  Heavy alcohol  consumption Consuming four beers daily with motivation to reduce intake. Family history of alcoholism. - Continue to reduce alcohol  consumption gradually. - Monitor for withdrawal symptoms.  Recurrent epistaxis Recurrent epistaxis likely due to nasal dryness and allergies. Planning ENT evaluation. - Continue saline nasal spray three times daily. - Schedule appointment with ENT for evaluation and possible cauterization.  Eustachian tube dysfunction  Fluid in ears causing dizziness and motion sickness, exacerbated by allergies. - Consider nasal spray if ENT appointment is delayed. - Continue saline nasal spray for symptomatic relief.   Lucie Buttner, PA-C

## 2024-05-07 NOTE — Progress Notes (Signed)
 Dear Dr. Job, Here is my assessment for our mutual patient, Bernard Mora. Thank you for allowing me the opportunity to care for your patient. Please do not hesitate to contact me should you have any other questions. Sincerely, Dr. Eldora Blanch  Otolaryngology Clinic Note  HISTORY: Bernard Mora is a 48 y.o. male kindly referred by Dr. Job for evaluation of nasal congestion and epistaxis  Initial visit (04/2024): He has left sided congestion, no anterior rhinorrhea, but does have PND. Feels like it has started when he moved into a new home (new construction) -- lot of trees, and over 10 months, his congestion and sneezing is much worse. He reports left epistaxis when he manipulates or blows his nose. Stops with pressure. Nose does feel dry sometimes. Does have ear fullness as well and needs to pop his ears. No frequent ear infections.   Denies CRS symptoms - no recent sinusitis.   He has been using saline spray and that has helped. He has tried INCS but stopped as a result of the bleeds. Has not tried astelin . No rinsing prior.   No nasal trauma.  Does have some typical AR symptoms - sometimes itchy nose, no ocular symptoms; spring is worst.   Allergy testing has been done as a child - allergic to dust mites, [place] grass. No previous sinonasal surgery.  GLP-1: no AP/AC: no  Tobacco: no EtOH: 4 beers/day (04/2024)  PMHx: HTN, GAD, GERD, HLD  RADIOGRAPHIC EVALUATION AND INDEPENDENT REVIEW OF OTHER RECORDS:: Lucie Job (05/07/2024): noted recent epistaxis over past month from left side, suspect dryness; h/o allergies with congestion and sneezing; using slaine spray currently; Dx: Epistaxis, nasal congestion, ETD; Rx: ref to ENT CBC and CMP 09/17/2023: BUN/Cr 10/0.97; WBC 5.1, Hgb 14.8, Eos 100 Past Medical History:  Diagnosis Date   Allergy    Anxiety    Arthritis    GERD (gastroesophageal reflux disease)    Hyperlipidemia    Hypertension    Past Surgical History:   Procedure Laterality Date   arm fracture Left    crack ribs Right 07/30/2016   FRACTURE SURGERY Left 1993   wrist   TONSILLECTOMY     Family History  Problem Relation Age of Onset   Arthritis Mother    Diverticulitis Mother        did require colostomy   Cancer Father        don't know the type, possible kidney/lung and mets   Other Brother        skin lesions   Diabetes Paternal Grandfather    Colon cancer Neg Hx    Esophageal cancer Neg Hx    Rectal cancer Neg Hx    Stomach cancer Neg Hx    Social History   Tobacco Use   Smoking status: Never    Passive exposure: Never   Smokeless tobacco: Never  Substance Use Topics   Alcohol  use: Yes    Alcohol /week: 28.0 standard drinks of alcohol     Types: 28 Cans of beer per week    Comment: daily beer   No Known Allergies Current Outpatient Medications  Medication Sig Dispense Refill   amLODipine  (NORVASC ) 2.5 MG tablet Take 1 tablet (2.5 mg total) by mouth daily. 90 tablet 1   azelastine  (ASTELIN ) 0.1 % nasal spray Place 2 sprays into both nostrils 2 (two) times daily. Use in each nostril as directed 30 mL 12   fluticasone (FLONASE  SENSIMIST) 27.5 MCG/SPRAY nasal spray Place 2 sprays into the nose in  the morning and at bedtime. 10 g 12   ibuprofen (ADVIL,MOTRIN) 200 MG tablet Take 200-600 mg by mouth as needed.     Multiple Vitamin (MULTIVITAMIN) tablet Take 1 tablet by mouth as needed.     sertraline  (ZOLOFT ) 50 MG tablet Take 1.5 tablets (75 mg total) by mouth daily. 180 tablet 1   Current Facility-Administered Medications  Medication Dose Route Frequency Provider Last Rate Last Admin   0.9 %  sodium chloride  infusion  500 mL Intravenous Once Stacia Hamilton E, MD       BP 135/85 (BP Location: Right Arm, Patient Position: Sitting, Cuff Size: Large)   Pulse 76   Ht 6' (1.829 m)   Wt 205 lb (93 kg)   SpO2 96%   BMI 27.80 kg/m   PHYSICAL EXAM:  BP 135/85 (BP Location: Right Arm, Patient Position: Sitting, Cuff  Size: Large)   Pulse 76   Ht 6' (1.829 m)   Wt 205 lb (93 kg)   SpO2 96%   BMI 27.80 kg/m    Salient findings:  CN II-XII intact Bilateral EAC clear and TM intact with well pneumatized middle ear spaces Nose: Anterior rhinoscopy reveals modestly dry nasal mucosa with small crust and b/l prominent septal vessels; no active epistaxis; bilateral inferior turbinate hypertrophy; septum dev right.  Nasal endoscopy was indicated to better evaluate the nose and paranasal sinuses, given the patient's history and exam findings, and is detailed below. No lesions of oral cavity/oropharynx No obviously palpable neck masses/lymphadenopathy/thyromegaly No respiratory distress or stridor   PROCEDURE:  Prior to initiating any procedures, risks/benefits/alternatives were explained to the patient and verbal consent obtained. Diagnostic Nasal Endoscopy Pre-procedure diagnosis: Epistaxis, nasal congestion Post-procedure diagnosis: same Indication: See pre-procedure diagnosis and physical exam above Complications: None apparent EBL: 0 mL Anesthesia: Lidocaine 4% and topical decongestant was topically sprayed in each nasal cavity  Description of Procedure:  Patient was identified. A rigid 30 degree endoscope was utilized to evaluate the sinonasal cavities, mucosa, sinus ostia and turbinates and septum.  Overall, signs of mucosal inflammation are not noted.  Also noted are findings as above  No mucopurulence, polyps, or masses noted.   Right Middle meatus: clear Right SE Recess: clear Left MM: clear Left SE Recess: clear  Photodocumentation was obtained.  CPT CODE -- 31231 - Mod 25   ASSESSMENT:  48 y.o. with:  1. Hypertrophy of both inferior nasal turbinates   2. Nasal congestion   3. Nasal septal deviation   4. Nasal obstruction   5. Dysfunction of both eustachian tubes   6. Other allergic rhinitis   7. Epistaxis   8. Nasal dryness    Multiple issues. Likely multiple etiology of his sx --  from congestion standpoint, allergies and septum/turbs playing a role. Dryness and prominent vessels contributing to epistaxis. ETD likely as a result of possible allergies.  PLAN: We've discussed issues and options today.  We reviewed the nasal endoscopy images together.  The risks, benefits and alternatives were discussed and questions answered.  He has elected to proceed with medical mgmt  1) Humidification with daily rinses, ayr gel and nasal saline spray 2) Consider cautery if frequent bleeding - he declined today 3) Astelin  and flonase  sensi BID if nose less dry 4) consider septo/turbs He opted for PRN f/u  See below regarding exact medications prescribed this encounter including dosages and route: Meds ordered this encounter  Medications   fluticasone (FLONASE  SENSIMIST) 27.5 MCG/SPRAY nasal spray    Sig: Place  2 sprays into the nose in the morning and at bedtime.    Dispense:  10 g    Refill:  12   azelastine  (ASTELIN ) 0.1 % nasal spray    Sig: Place 2 sprays into both nostrils 2 (two) times daily. Use in each nostril as directed    Dispense:  30 mL    Refill:  12     Thank you for allowing me the opportunity to care for your patient. Please do not hesitate to contact me should you have any other questions.  Sincerely, Eldora Blanch, MD Otolaryngologist (ENT), [facility] Phone: [phone] Fax: [phone]  MDM:  Level 4: [phone] Complexity/Problems addressed: mod - multiple worsening chronic problems Data complexity: mod - independent review of notes, multiple labs - Morbidity: mod  - Prescription Drug prescribed or managed: y  05/07/2024, 2:05 PM

## 2024-06-01 ENCOUNTER — Encounter: Payer: Self-pay | Admitting: Physician Assistant

## 2024-06-02 ENCOUNTER — Ambulatory Visit: Admitting: Physician Assistant

## 2024-06-02 ENCOUNTER — Ambulatory Visit (INDEPENDENT_AMBULATORY_CARE_PROVIDER_SITE_OTHER)

## 2024-06-02 ENCOUNTER — Encounter: Payer: Self-pay | Admitting: Physician Assistant

## 2024-06-02 VITALS — BP 132/80 | HR 76 | Wt 207.8 lb

## 2024-06-02 DIAGNOSIS — M545 Low back pain, unspecified: Secondary | ICD-10-CM

## 2024-06-02 DIAGNOSIS — R202 Paresthesia of skin: Secondary | ICD-10-CM | POA: Diagnosis not present

## 2024-06-02 DIAGNOSIS — F419 Anxiety disorder, unspecified: Secondary | ICD-10-CM | POA: Diagnosis not present

## 2024-06-02 DIAGNOSIS — G8929 Other chronic pain: Secondary | ICD-10-CM

## 2024-06-02 DIAGNOSIS — Z789 Other specified health status: Secondary | ICD-10-CM | POA: Diagnosis not present

## 2024-06-02 DIAGNOSIS — I10 Essential (primary) hypertension: Secondary | ICD-10-CM

## 2024-06-02 LAB — IBC + FERRITIN
Ferritin: 47.8 ng/mL (ref 22.0–322.0)
Iron: 106 ug/dL (ref 42–165)
Saturation Ratios: 30.4 % (ref 20.0–50.0)
TIBC: 348.6 ug/dL (ref 250.0–450.0)
Transferrin: 249 mg/dL (ref 212.0–360.0)

## 2024-06-02 LAB — TSH: TSH: 1.74 u[IU]/mL (ref 0.35–5.50)

## 2024-06-02 LAB — B12 AND FOLATE PANEL
Folate: >22.9 ng/mL (ref >5.9)
Vitamin B-12: 356 pg/mL (ref 211–911)

## 2024-06-02 MED ORDER — SERTRALINE HCL 50 MG PO TABS: 25.0000 mg | ORAL_TABLET | Freq: Every day | ORAL | Status: DC

## 2024-06-02 NOTE — Progress Notes (Signed)
 Bernard Mora is a 48 y.o. male here for a follow up of a pre-existing problem.  History of Present Illness:   Chief Complaint  Patient presents with   Tingling    1 month; in neck. Noticed after altering dosage of Zoloft . Pt also mentions lower back pain that started this weekend. Pt has tried Ibuprofen.    Discussed the use of AI scribe software for clinical note transcription with the patient, who gave verbal consent to proceed.  History of Present Illness   Bernard Mora is a 48 year old male who presents with tingling sensations and back pain.  He experiences a tingling sensation in his neck for three to four weeks, described as a feather-like touch, with cold chills and temperature swings. The tingling extends to his legs, causing muscle fatigue and reluctance to drive. He questions if this is related to back issues or a side effect of Zoloft  and alcohol .  He has significant back pain, worsened by activities like sitting on the floor or bending. This pain is linked to past injuries and recent activities like painting. Certain positions, such as sitting with legs crossed, exacerbate the pain, and he feels stiffness after physical activity. He suspects his driving position may aggravate his back pain.  He uses Zoloft , recently adjusted to 25 mg, and consumes alcohol  regularly, which he believes may affect his symptoms. He experiences lightheadedness and dizziness, possibly related to Zoloft  or alcohol . He switched Zoloft  dosing to nighttime to avoid drowsiness. He uses nasal sprays prescribed by an ENT, causing drowsiness and an unpleasant taste.  His blood sugars are trending up, with A1c in the prediabetic range. He is working on weight loss, reducing from 212 to 206 pounds, and is mindful of his diet, limiting sweets and carbohydrates. He drinks plenty of water and avoids sugary drinks.        Past Medical History:  Diagnosis Date   Allergy    Anxiety    Arthritis    GERD  (gastroesophageal reflux disease)    Hyperlipidemia    Hypertension      Social History   Tobacco Use   Smoking status: Never    Passive exposure: Never   Smokeless tobacco: Never  Vaping Use   Vaping status: Never Used  Substance Use Topics   Alcohol  use: Yes    Alcohol /week: 28.0 standard drinks of alcohol     Types: 28 Cans of beer per week    Comment: daily beer   Drug use: No    Past Surgical History:  Procedure Laterality Date   arm fracture Left    crack ribs Right 07/30/2016   FRACTURE SURGERY Left 1993   wrist   TONSILLECTOMY      Family History  Problem Relation Age of Onset   Arthritis Mother    Diverticulitis Mother        did require colostomy   Cancer Father        don't know the type, possible kidney/lung and mets   Other Brother        skin lesions   Diabetes Paternal Grandfather    Colon cancer Neg Hx    Esophageal cancer Neg Hx    Rectal cancer Neg Hx    Stomach cancer Neg Hx     No Known Allergies  Current Medications:   Current Outpatient Medications:    amLODipine  (NORVASC ) 2.5 MG tablet, Take 1 tablet (2.5 mg total) by mouth daily., Disp: 90 tablet, Rfl: 1   azelastine  (  ASTELIN ) 0.1 % nasal spray, Place 2 sprays into both nostrils 2 (two) times daily. Use in each nostril as directed, Disp: 30 mL, Rfl: 12   fluticasone (FLONASE  SENSIMIST) 27.5 MCG/SPRAY nasal spray, Place 2 sprays into the nose in the morning and at bedtime., Disp: 10 g, Rfl: 12   ibuprofen (ADVIL,MOTRIN) 200 MG tablet, Take 200-600 mg by mouth as needed., Disp: , Rfl:    Multiple Vitamin (MULTIVITAMIN) tablet, Take 1 tablet by mouth as needed., Disp: , Rfl:    sertraline  (ZOLOFT ) 50 MG tablet, Take 0.5 tablets (25 mg total) by mouth daily., Disp: , Rfl:   Current Facility-Administered Medications:    0.9 %  sodium chloride  infusion, 500 mL, Intravenous, Once, Stacia Glendia BRAVO, MD   Review of Systems:   Negative unless otherwise specified per HPI.  Vitals:    Vitals:   06/02/24 1125  BP: 132/80  Pulse: 76  SpO2: 97%  Weight: 207 lb 12.8 oz (94.3 kg)     Body mass index is 28.18 kg/m.  Physical Exam:   Physical Exam Vitals and nursing note reviewed.  Constitutional:      General: He is not in acute distress.    Appearance: He is well-developed. He is not ill-appearing or toxic-appearing.  Cardiovascular:     Rate and Rhythm: Normal rate and regular rhythm.     Pulses: Normal pulses.     Heart sounds: Normal heart sounds, S1 normal and S2 normal.  Pulmonary:     Effort: Pulmonary effort is normal.     Breath sounds: Normal breath sounds.  Musculoskeletal:     Comments: No spinal tenderness to palpation   Skin:    General: Skin is warm and dry.  Neurological:     General: No focal deficit present.     Mental Status: He is alert.     GCS: GCS eye subscore is 4. GCS verbal subscore is 5. GCS motor subscore is 6.     Cranial Nerves: Cranial nerves 2-12 are intact.     Sensory: Sensation is intact.     Motor: Motor function is intact.     Coordination: Coordination is intact.     Gait: Gait is intact.  Psychiatric:        Speech: Speech normal.        Behavior: Behavior normal. Behavior is cooperative.     Assessment and Plan:   Assessment and Plan    Chronic right-sided low back pain, unspecified whether sciatica present  Chronic low back pain with leg tingling and fatigue, possibly due to lumbar nerve impingement or inflammation. Alcohol  use and vitamin deficiencies may influence symptoms. - Order lumbar x-ray. - Perform blood work for vitamin and mineral levels, including B12 and B1. - Consider sports medicine referral if symptoms persist.  Heavy alcohol  consumption Regular evening alcohol  consumption may contribute to tingling, fatigue, and vitamin deficiencies. No addiction signs, but long-term effects are a concern. Alcohol  may exacerbate sertraline  side effects. - Encourage reduction of alcohol  intake. - Suggest  alternatives like [place] or decaf coffee. - Discuss potential medication for cravings, though he prefers non-medication approach.  Tingling sensation On 25 mg sertraline , experiencing tingling and fatigue, possibly due to medication or alcohol  interaction. Recent dose reduction from 50 mg. - Continue current sertraline  dosage. - Monitor symptom changes with reduced alcohol  intake. - Consider neurology evaluation or brain imaging if persists  Hypertension Blood pressure well-managed with amlodipine  2.5 mg. - Continue current antihypertensive regimen.  Lucie Buttner, PA-C

## 2024-06-04 ENCOUNTER — Ambulatory Visit: Payer: Self-pay | Admitting: Physician Assistant

## 2024-06-04 ENCOUNTER — Encounter: Payer: Self-pay | Admitting: Physician Assistant

## 2024-06-05 ENCOUNTER — Encounter: Payer: Self-pay | Admitting: Physician Assistant

## 2024-06-05 NOTE — Telephone Encounter (Signed)
 Called Radiology and spoke to [facility] asked him to have them read Lumbar Spine done on 10/14. Gabe verbalized understanding and said he will add it to the list.

## 2024-06-07 LAB — VITAMIN B1: Vitamin B1 (Thiamine): 17 nmol/L (ref 8–30)

## 2024-06-08 ENCOUNTER — Other Ambulatory Visit: Payer: Self-pay | Admitting: Physician Assistant

## 2024-06-08 DIAGNOSIS — M545 Low back pain, unspecified: Secondary | ICD-10-CM

## 2024-06-18 ENCOUNTER — Ambulatory Visit: Admitting: Physical Therapy

## 2024-06-18 DIAGNOSIS — M5459 Other low back pain: Secondary | ICD-10-CM | POA: Diagnosis not present

## 2024-06-18 NOTE — Therapy (Unsigned)
 OUTPATIENT PHYSICAL THERAPY LOWER EXTREMITY EVALUATION   Patient Name: Bernard Mora MRN: [id number] DOB:Dec 05, 1975, 48 y.o., male Today's Date: 06/18/2024  END OF SESSION:   Past Medical History:  Diagnosis Date   Allergy    Anxiety    Arthritis    GERD (gastroesophageal reflux disease)    Hyperlipidemia    Hypertension    Past Surgical History:  Procedure Laterality Date   arm fracture Left    crack ribs Right 07/30/2016   FRACTURE SURGERY Left 1993   wrist   TONSILLECTOMY     Patient Active Problem List   Diagnosis Date Noted   Angioma of skin 03/18/2024   Hemangioma of skin and subcutaneous tissue 03/18/2024   Nevus of left shoulder 03/18/2024   Viral URI with cough 03/22/2023   Hyperhidrosis 08/26/2017   Anxiety 07/03/2017   Costochondral separation 10/24/2016       PCP: ***  REFERRING PROVIDER: ***  REFERRING DIAG: ***  THERAPY DIAG:  No diagnosis found.  Rationale for Evaluation and Treatment: Rehabilitation  ONSET DATE: ***    SUBJECTIVE:   SUBJECTIVE STATEMENT:  Back pain for a few years. Worse lastely , due to driving . R sided low back pain.  Sitting on floor- cross leg- fwd increased pain-  Stand/bend-- then standing back up- pain.  Previous sports injuries- heavy lifting- hurt back-  high jump accident- landed on R side, concussions (2) - no residual symptoms.  Exercise: light lifting weights-  less stretching.  Able to do squats-   Drives a lot for work-   Did have some tingling into thighs- improved since taking B12 -  Can have some    PERTINENT HISTORY: HTN,    PAIN:  Are you having pain? Yes: NPRS scale: up to 2-3 with driving/10,  1/89 getting up from low bend.  Pain location: R low back  Pain description: sore,  Aggravating factors: sitting, driving,  Relieving factors: ***  PRECAUTIONS: None  WEIGHT BEARING RESTRICTIONS: No  FALLS:  Has patient fallen in last 6 months? No   PLOF: Independent  PATIENT GOALS:   Decreased pain   NEXT MD VISIT:   OBJECTIVE:   DIAGNOSTIC FINDINGS:   PATIENT SURVEYS:    COGNITION: Overall cognitive status: Within functional limits for tasks assessed     SENSATION: WFL  EDEMA:  n/a   POSTURE:    No Significant postural limitations  PALPATION:   LOWER EXTREMITY ROM:  Active /Passive ROM Left eval Right eval  Hip flexion    Hip extension    Hip abduction    Hip adduction    Hip internal rotation    Hip external rotation    Knee flexion    Knee extension    Ankle dorsiflexion    Ankle plantarflexion    Ankle inversion    Ankle eversion     (Blank rows = not tested)  LOWER EXTREMITY MMT:  MMT Left eval Right  eval  Hip flexion    Hip extension    Hip abduction    Hip adduction    Hip internal rotation    Hip external rotation    Knee flexion    Knee extension    Ankle dorsiflexion    Ankle plantarflexion    Ankle inversion    Ankle eversion     (Blank rows = not tested)  LOWER EXTREMITY SPECIAL TESTS:  Stork: Neg SLR: Neg  Repeated extensions- standing: comfortable. Improved pain   FUNCTIONAL TESTS:  GAIT: Unremarkable    TODAY'S TREATMENT:                                                                                                                              DATE:  Eval:  Therapeutic Exercise: Aerobic: Supine: Prone: prone on elbows x 2 min;  prone press ups (pain free) - to elbows 2 x 10 Seated: Standing:  extension x 15 Stretches:  Neuromuscular Re-education: Manual Therapy: Therapeutic Activity: Self Care: Education on seat position, low vs high, for car and work.     PATIENT EDUCATION:  Education details: PT POC, Exam findings, HEP Person educated: Patient Education method: Explanation, Demonstration, Tactile cues, Verbal cues, and Handouts Education comprehension: verbalized understanding, returned demonstration, verbal cues required, tactile cues required, and needs further  education   HOME EXERCISE PROGRAM: Access Code: [email or web address] URL: [name] Date: 06/18/2024 Prepared by: Tinnie Don  Exercises - Prone on Elbows Stretch  - 1-  hold - Prone Press Up On Elbows  - 3-5 x daily - 1-2 sets - 10 reps - 5 sec  hold - Standing Lumbar Extension  - 2-3 x daily - 1-2 sets - 10 reps  ASSESSMENT:  CLINICAL IMPRESSION: Patient presents with primary complaint of  ***   Pt with decreased ability for full functional activities. Pt will  benefit from skilled PT to improve deficits and pain and to return to PLOF.   OBJECTIVE IMPAIRMENTS: {opptimpairments:25111}.   ACTIVITY LIMITATIONS: {activitylimitations:27494}  PARTICIPATION LIMITATIONS: {participationrestrictions:25113}  PERSONAL FACTORS: {Personal factors:25162} are also affecting patient's functional outcome.   REHAB POTENTIAL: {rehabpotential:25112}  CLINICAL DECISION MAKING: {clinical decision making:25114}  EVALUATION COMPLEXITY: {Evaluation complexity:25115}   GOALS: Goals reviewed with patient? Yes  SHORT TERM GOALS: Target date: 07/09/2024    ***  Goal status: INITIAL  2.  ***  Goal status: INITIAL  3.  ***  Goal status: INITIAL    LONG TERM GOALS: Target date: 08/13/2024    ***  Goal status: INITIAL  2.  ***  Goal status: INITIAL  3.  ***  Goal status: INITIAL  4.  *** Baseline:  Goal status: INITIAL  5.  ***  Goal status: INITIAL     PLAN:  PT FREQUENCY: 1-2x/week  PT DURATION: 8 weeks  PLANNED INTERVENTIONS: Therapeutic exercises, Therapeutic activity, Neuromuscular re-education, Patient/Family education, Self Care, Joint mobilization, Joint manipulation, Stair training, Orthotic/Fit training, DME instructions, Aquatic Therapy, Dry Needling, Electrical stimulation, Cryotherapy, Moist heat, Taping, Ultrasound, Ionotophoresis 4mg /ml Dexamethasone, Manual therapy,  Vasopneumatic device, Traction, Spinal manipulation,  Spinal mobilization,Balance training, Gait training,   PLAN FOR NEXT SESSION:   Tinnie Don, PT, DPT 8:04 AM  06/18/24

## 2024-06-19 ENCOUNTER — Encounter: Payer: Self-pay | Admitting: Physical Therapy

## 2024-06-30 ENCOUNTER — Encounter: Payer: Self-pay | Admitting: Physical Therapy

## 2024-06-30 ENCOUNTER — Ambulatory Visit (INDEPENDENT_AMBULATORY_CARE_PROVIDER_SITE_OTHER): Admitting: Physical Therapy

## 2024-06-30 DIAGNOSIS — M5459 Other low back pain: Secondary | ICD-10-CM | POA: Diagnosis not present

## 2024-06-30 NOTE — Therapy (Signed)
 OUTPATIENT PHYSICAL THERAPY LOWER EXTREMITY TREATMENT   Patient Name: Bernard Mora MRN: [id number] DOB:[birth year], 48 y.o., male Today's Date: 06/30/24   END OF SESSION:  PT End of Session - 06/30/24 0846     Visit Number 2    Number of Visits 16    Date for Recertification  08/13/24    Authorization Type [employer]    PT Start Time 0848    PT Stop Time 0929    PT Time Calculation (min) 41 min    Activity Tolerance Patient tolerated treatment well    Behavior During Therapy [facility] for tasks assessed/performed          Past Medical History:  Diagnosis Date   Allergy    Anxiety    Arthritis    GERD (gastroesophageal reflux disease)    Hyperlipidemia    Hypertension    Past Surgical History:  Procedure Laterality Date   arm fracture Left    crack ribs Right 07/30/2016   FRACTURE SURGERY Left 1993   wrist   TONSILLECTOMY     Patient Active Problem List   Diagnosis Date Noted   Angioma of skin 03/18/2024   Hemangioma of skin and subcutaneous tissue 03/18/2024   Nevus of left shoulder 03/18/2024   Viral URI with cough 03/22/2023   Hyperhidrosis 08/26/2017   Anxiety 07/03/2017   Costochondral separation 10/24/2016     PCP: Lucie Buttner  REFERRING PROVIDER: Lucie Buttner  REFERRING DIAG: Low back pain   THERAPY DIAG:  Other low back pain  Rationale for Evaluation and Treatment: Rehabilitation  ONSET DATE:     SUBJECTIVE:   SUBJECTIVE STATEMENT: Pt states back pain about the same. He has been doing HEP. Trying to sit up higher in car, which he thinks is helping some. Is also still feeling some tingling into bil/lateral hips.   Eval: Pt states Back pain for a few years. Worse lastely , due to driving for work . R sided low back pain. No pain on L. Increased pain with sitting/driving, as well as if he tries to sit on floor, in a deep flexed position. Notes much pain when trying to get back up.  Previous sports injuries- heavy lifting- hurt back-  high jump  accident- landed on R side, concussions (2) - no residual symptoms from concussions.  Current Exercise: light lifting weights-  less stretching.  Able to do squats without pain  Did have some tingling into bil thighs- improved since taking B12 , but still has some intermittently  Denies any radicular pain  Sleeps in all positions   PERTINENT HISTORY: HTN,    PAIN:  Are you having pain? Yes: NPRS scale: up to 2-3 with driving/10,  1/89 getting up from low bend.  Pain location: R low back  Pain description: sore,  Aggravating factors: sitting, driving,  Relieving factors: walking around, movement,   PRECAUTIONS: None  WEIGHT BEARING RESTRICTIONS: No  FALLS:  Has patient fallen in last 6 months? No   PLOF: Independent  PATIENT GOALS:  Decreased pain in back ,improve sitting/driving tolerance   NEXT MD VISIT:   OBJECTIVE:   DIAGNOSTIC FINDINGS:   PATIENT SURVEYS:    COGNITION: Overall cognitive status: Within functional limits for tasks assessed     SENSATION: WFL  EDEMA:  n/a   POSTURE:    No Significant postural limitations  PALPATION: Tenderness in Low lumbar L4/5 on R, into R SI.  No tenderness in glute med or piriformis.   LOWER EXTREMITY ROM: LEs:  WFL Lumbar: WFL   LOWER EXTREMITY MMT:  MMT Left eval Right  eval  Hip flexion 4+ 4+  Hip extension    Hip abduction 4+ 4+  Hip adduction    Hip internal rotation    Hip external rotation    Knee flexion 5 5  Knee extension 5 5  Ankle dorsiflexion    Ankle plantarflexion    Ankle inversion    Ankle eversion     (Blank rows = not tested)  LOWER EXTREMITY SPECIAL TESTS:  Stork: Neg SLR: Neg  Repeated extensions- standing: comfortable. Improved pain   FUNCTIONAL TESTS:    GAIT: Unremarkable    TODAY'S TREATMENT:                                                                                                                              DATE:   06/30/24: Therapeutic  Exercise: Aerobic: Supine: LTR x 10;  Supine march with TA- education on core control;    bridging x 15 ;  Prone:   prone on elbows x 1 min;  prone press ups  to elbows x 10,  to hands x 10;(and x 10 after exercise)   Seated: Standing:  extension 2 x 10 after exercise ;    Stretches:  Neuromuscular Re-education: Manual Therapy: DTM R lumbar paraspinal, QL, SI mobs and lumbar PA mobs  Therapeutic Activity: Squat x 10,  x 10 with 20lb- cueing for posture and mechanics Review of TRX squats for back comfort Rows with TA Black TB x 15;  Planks 20 sec x 3  Self Care:     Eval:  Therapeutic Exercise: Aerobic: Supine: Prone: prone on elbows x 2 min;  prone press ups (pain free) - to elbows 2 x 10 Seated: Standing:  extension x 15 Stretches:  Neuromuscular Re-education: Manual Therapy: Therapeutic Activity: Self Care: Education on seat position, low vs high, for car and work.     PATIENT EDUCATION:  Education details: Updated and reviewed HEP  Person educated: Patient Education method: Explanation, Demonstration, Tactile cues, Verbal cues, and Handouts Education comprehension: verbalized understanding, returned demonstration, verbal cues required, tactile cues required, and needs further education   HOME EXERCISE PROGRAM: Access Code: [email or web address] URL: [email or web address] Date: 06/18/2024 Prepared by: Tinnie Don  Exercises - Prone on Elbows Stretch  - 1-  hold - Prone Press Up On Elbows  - 3-5 x daily - 1-2 sets - 10 reps - 5 sec  hold - Standing Lumbar Extension  - 2-3 x daily - 1-2 sets - 10 reps  ASSESSMENT:  CLINICAL IMPRESSION: Pt with good tolerance for activities today without increased pain. Continued education on extension vs flexion at this time. Pt will be driving after he leaves PT today, will note improvements or  not , after doing more extension this AM. Education and practice for core control with activities today. Recommended more  frequent extension and mobility throughout the day.   Eval:  Patient presents with primary complaint of  pain in R side of low back. He has symptoms consistent with disc pathology vs SIJ. He has increased pain on R side with sustained flexion, sitting and driving which he is required to do for work. He has no pain with trial for extension today. He has lack of effective HEP for ongoing pain. Pt with decreased ability for full functional activities. Pt will  benefit from skilled PT to improve deficits and pain and to return to PLOF.   OBJECTIVE IMPAIRMENTS: decreased activity tolerance, decreased strength, increased muscle spasms, improper body mechanics, and pain.   ACTIVITY LIMITATIONS: lifting, bending, sitting, squatting, and locomotion level  PARTICIPATION LIMITATIONS: cleaning, driving, community activity, and occupation  PERSONAL FACTORS: Past/current experiences and Time since onset of injury/illness/exacerbation are also affecting patient's functional outcome.   REHAB POTENTIAL: Good  CLINICAL DECISION MAKING: Stable/uncomplicated  EVALUATION COMPLEXITY: Low   GOALS: Goals reviewed with patient? Yes  SHORT TERM GOALS: Target date: 07/09/2024  Pt to be independent with initial HEP  Goal status: MET  2.  Pt to report standing and doing lumbar extension when getting out of car after driving.   Goal status: INITIAL    LONG TERM GOALS: Target date: 08/13/2024   Pt to be independent with final HEP  Goal status: INITIAL  2.  Pt to report ability for driving at least 1 hour with pain 0-1/10 in back  Goal status: INITIAL  3.  Pt to demo ability for bend, squat, with optimal mechanics and no pain in back, to improve safety with IADLs and work duties   Goal status: INITIAL  4.  Pt to report decreased pain in R low back to 0-1/10 with activity and at least 30 min of exercise.   Goal status: INITIAL    PLAN:  PT FREQUENCY: 1-2x/week  PT DURATION: 8  weeks  PLANNED INTERVENTIONS: Therapeutic exercises, Therapeutic activity, Neuromuscular re-education, Patient/Family education, Self Care, Joint mobilization, Joint manipulation, Stair training, Orthotic/Fit training, DME instructions, Aquatic Therapy, Dry Needling, Electrical stimulation, Cryotherapy, Moist heat, Taping, Ultrasound, Ionotophoresis 4mg /ml Dexamethasone, Manual therapy,  Vasopneumatic device, Traction, Spinal manipulation, Spinal mobilization,Balance training, Gait training,   PLAN FOR NEXT SESSION:   Tinnie Don, PT, DPT 9:40 AM  06/30/24

## 2024-07-06 ENCOUNTER — Ambulatory Visit: Admitting: Physical Therapy

## 2024-07-06 ENCOUNTER — Encounter: Payer: Self-pay | Admitting: Physical Therapy

## 2024-07-06 DIAGNOSIS — M5459 Other low back pain: Secondary | ICD-10-CM | POA: Diagnosis not present

## 2024-07-06 NOTE — Therapy (Signed)
 OUTPATIENT PHYSICAL THERAPY LOWER EXTREMITY TREATMENT   Patient Name: Bernard Mora MRN: [id number] DOB:25-Jul-1976, 48 y.o., male Today's Date: 07/06/24   END OF SESSION:  PT End of Session - 07/06/24 0855     Visit Number 3    Number of Visits 16    Date for Recertification  08/13/24    Authorization Type [employer]    PT Start Time 0850    PT Stop Time 0930    PT Time Calculation (min) 40 min    Activity Tolerance Patient tolerated treatment well    Behavior During Therapy [facility] for tasks assessed/performed           Past Medical History:  Diagnosis Date   Allergy    Anxiety    Arthritis    GERD (gastroesophageal reflux disease)    Hyperlipidemia    Hypertension    Past Surgical History:  Procedure Laterality Date   arm fracture Left    crack ribs Right 07/30/2016   FRACTURE SURGERY Left 1993   wrist   TONSILLECTOMY     Patient Active Problem List   Diagnosis Date Noted   Angioma of skin 03/18/2024   Hemangioma of skin and subcutaneous tissue 03/18/2024   Nevus of left shoulder 03/18/2024   Viral URI with cough 03/22/2023   Hyperhidrosis 08/26/2017   Anxiety 07/03/2017   Costochondral separation 10/24/2016     PCP: Lucie Buttner  REFERRING PROVIDER: Lucie Buttner  REFERRING DIAG: Low back pain   THERAPY DIAG:  Other low back pain  Rationale for Evaluation and Treatment: Rehabilitation  ONSET DATE:     SUBJECTIVE:   SUBJECTIVE STATEMENT: Pt states continued tingling sensation in neck and into head. Tingling in legs better, but still intermittent. Pain was better last week, and over the weekend. He had better tolerance for driving as well as being able to do some exercise over the weekend without pain.   Eval: Pt states Back pain for a few years. Worse lastely , due to driving for work . R sided low back pain. No pain on L. Increased pain with sitting/driving, as well as if he tries to sit on floor, in a deep flexed position. Notes much pain when  trying to get back up.  Previous sports injuries- heavy lifting- hurt back-  high jump accident- landed on R side, concussions (2) - no residual symptoms from concussions.  Current Exercise: light lifting weights-  less stretching.  Able to do squats without pain  Did have some tingling into bil thighs- improved since taking B12 , but still has some intermittently  Denies any radicular pain  Sleeps in all positions   PERTINENT HISTORY: HTN,    PAIN:  Are you having pain? Yes: NPRS scale: up to 2-3 with driving/10,  1/89 getting up from low bend.  Pain location: R low back  Pain description: sore,  Aggravating factors: sitting, driving,  Relieving factors: walking around, movement,   PRECAUTIONS: None  WEIGHT BEARING RESTRICTIONS: No  FALLS:  Has patient fallen in last 6 months? No   PLOF: Independent  PATIENT GOALS:  Decreased pain in back ,improve sitting/driving tolerance   NEXT MD VISIT:   OBJECTIVE:   DIAGNOSTIC FINDINGS:   PATIENT SURVEYS:    COGNITION: Overall cognitive status: Within functional limits for tasks assessed     SENSATION: WFL  EDEMA:  n/a   POSTURE:    No Significant postural limitations  PALPATION: Tenderness in Low lumbar L4/5 on R, into R SI.  No tenderness in glute med or piriformis.   LOWER EXTREMITY ROM: LEs: WFL Lumbar: WFL   LOWER EXTREMITY MMT:  MMT Left eval Right  eval  Hip flexion 4+ 4+  Hip extension    Hip abduction 4+ 4+  Hip adduction    Hip internal rotation    Hip external rotation    Knee flexion 5 5  Knee extension 5 5  Ankle dorsiflexion    Ankle plantarflexion    Ankle inversion    Ankle eversion     (Blank rows = not tested)  LOWER EXTREMITY SPECIAL TESTS:  Stork: Neg SLR: Neg  Repeated extensions- standing: comfortable. Improved pain   FUNCTIONAL TESTS:    GAIT: Unremarkable    TODAY'S TREATMENT:                                                                                                                               DATE:   07/06/24:  Therapeutic Exercise: Aerobic: Supine:     bridging x 10 ; with hip add squeeze x 15;  Prone:   ; prone press ups  to elbows x 10,  to hands x 15;(and x 10 after exercise)   Seated: reviewed UT and levator stretching for neck pain Standing:   Lumbar side bending/QL stretch x 4 bil;  Stretches:  Neuromuscular Re-education: Manual Therapy:  DTM R lumbar paraspinal, QL,  SI mobs and lumbar PA mobs  Therapeutic Activity: Paloff press double BlueTB x 20 bil;  Self Care:  reviewed pillow, sleeping, and driving positions for neck pain/tingling    06/30/24: Therapeutic Exercise: Aerobic: Supine: LTR x 10;  Supine march with TA- education on core control;    bridging x 15 ;  Prone:   prone on elbows x 1 min;  prone press ups  to elbows x 10,  to hands x 10;(and x 10 after exercise)   Seated: Standing:  extension 2 x 10 after exercise ;    Stretches:  Neuromuscular Re-education: Manual Therapy: DTM R lumbar paraspinal, QL, SI mobs and lumbar PA mobs  Therapeutic Activity: Squat x 10,  x 10 with 20lb- cueing for posture and mechanics Review of TRX squats for back comfort Rows with TA Black TB x 15;  Planks 20 sec x 3  Self Care:     Eval:  Therapeutic Exercise: Aerobic: Supine: Prone: prone on elbows x 2 min;  prone press ups (pain free) - to elbows 2 x 10 Seated: Standing:  extension x 15 Stretches:  Neuromuscular Re-education: Manual Therapy: Therapeutic Activity: Self Care: Education on seat position, low vs high, for car and work.     PATIENT EDUCATION:  Education details: Updated and reviewed HEP  Person educated: Patient Education method: Explanation, Demonstration, Tactile cues, Verbal cues, and Handouts Education comprehension: verbalized understanding, returned demonstration, verbal cues required, tactile cues required, and needs further education   HOME EXERCISE PROGRAM: Access Code: [email or web address] URL:  [email or web address] Date:  06/18/2024 Prepared by: Tinnie Don  Exercises - Prone on Elbows Stretch  - 1-  hold - Prone Press Up On Elbows  - 3-5 x daily - 1-2 sets - 10 reps - 5 sec  hold - Standing Lumbar Extension  - 2-3 x daily - 1-2 sets - 10 reps  ASSESSMENT:  CLINICAL IMPRESSION:   Pt with good ability for ther ex, without pain. He has had better tolerance for exercise in the last week at home also with less pain response afterwards. Plan to continue extension bias movement and continue to progress strength and education on body mechanics. Reviewed stretching for neck, and body mechanics for driving and sleeping for neck pain and tingling.   Eval: Patient presents with primary complaint of  pain in R side of low back. He has symptoms consistent with disc pathology vs SIJ. He has increased pain on R side with sustained flexion, sitting and driving which he is required to do for work. He has no pain with trial for extension today. He has lack of effective HEP for ongoing pain. Pt with decreased ability for full functional activities. Pt will  benefit from skilled PT to improve deficits and pain and to return to PLOF.   OBJECTIVE IMPAIRMENTS: decreased activity tolerance, decreased strength, increased muscle spasms, improper body mechanics, and pain.   ACTIVITY LIMITATIONS: lifting, bending, sitting, squatting, and locomotion level  PARTICIPATION LIMITATIONS: cleaning, driving, community activity, and occupation  PERSONAL FACTORS: Past/current experiences and Time since onset of injury/illness/exacerbation are also affecting patient's functional outcome.   REHAB POTENTIAL: Good  CLINICAL DECISION MAKING: Stable/uncomplicated  EVALUATION COMPLEXITY: Low   GOALS: Goals reviewed with patient? Yes  SHORT TERM GOALS: Target date: 07/09/2024  Pt to be independent with initial HEP  Goal status: MET  2.  Pt to report standing and doing lumbar extension when  getting out of car after driving.   Goal status: INITIAL    LONG TERM GOALS: Target date: 08/13/2024   Pt to be independent with final HEP  Goal status: INITIAL  2.  Pt to report ability for driving at least 1 hour with pain 0-1/10 in back  Goal status: INITIAL  3.  Pt to demo ability for bend, squat, with optimal mechanics and no pain in back, to improve safety with IADLs and work duties   Goal status: INITIAL  4.  Pt to report decreased pain in R low back to 0-1/10 with activity and at least 30 min of exercise.   Goal status: INITIAL    PLAN:  PT FREQUENCY: 1-2x/week  PT DURATION: 8 weeks  PLANNED INTERVENTIONS: Therapeutic exercises, Therapeutic activity, Neuromuscular re-education, Patient/Family education, Self Care, Joint mobilization, Joint manipulation, Stair training, Orthotic/Fit training, DME instructions, Aquatic Therapy, Dry Needling, Electrical stimulation, Cryotherapy, Moist heat, Taping, Ultrasound, Ionotophoresis 4mg /ml Dexamethasone, Manual therapy,  Vasopneumatic device, Traction, Spinal manipulation, Spinal mobilization,Balance training, Gait training,   PLAN FOR NEXT SESSION:   Tinnie Don, PT, DPT 8:56 AM  07/06/24

## 2024-07-09 ENCOUNTER — Other Ambulatory Visit: Payer: Self-pay | Admitting: Physician Assistant

## 2024-07-09 ENCOUNTER — Encounter: Payer: Self-pay | Admitting: Physician Assistant

## 2024-07-09 DIAGNOSIS — R202 Paresthesia of skin: Secondary | ICD-10-CM

## 2024-07-09 DIAGNOSIS — G8929 Other chronic pain: Secondary | ICD-10-CM

## 2024-07-13 ENCOUNTER — Encounter: Payer: Self-pay | Admitting: Physical Therapy

## 2024-07-13 ENCOUNTER — Ambulatory Visit: Admitting: Physical Therapy

## 2024-07-13 DIAGNOSIS — M5459 Other low back pain: Secondary | ICD-10-CM

## 2024-07-13 NOTE — Therapy (Signed)
 OUTPATIENT PHYSICAL THERAPY LOWER EXTREMITY TREATMENT   Patient Name: Bernard Mora MRN: [id number] DOB:1976/07/19, 48 y.o., male Today's Date: 07/13/24   END OF SESSION:  PT End of Session - 07/13/24 0849     Visit Number 4    Number of Visits 16    Date for Recertification  08/13/24    Authorization Type [employer]    PT Start Time [phone]    PT Stop Time 0930    PT Time Calculation (min) 41 min    Activity Tolerance Patient tolerated treatment well    Behavior During Therapy [facility] for tasks assessed/performed           Past Medical History:  Diagnosis Date   Allergy    Anxiety    Arthritis    GERD (gastroesophageal reflux disease)    Hyperlipidemia    Hypertension    Past Surgical History:  Procedure Laterality Date   arm fracture Left    crack ribs Right 07/30/2016   FRACTURE SURGERY Left 1993   wrist   TONSILLECTOMY     Patient Active Problem List   Diagnosis Date Noted   Angioma of skin 03/18/2024   Hemangioma of skin and subcutaneous tissue 03/18/2024   Nevus of left shoulder 03/18/2024   Viral URI with cough 03/22/2023   Hyperhidrosis 08/26/2017   Anxiety 07/03/2017   Costochondral separation 10/24/2016     PCP: Lucie Buttner  REFERRING PROVIDER: Lucie Buttner  REFERRING DIAG: Low back pain   THERAPY DIAG:  Other low back pain  Rationale for Evaluation and Treatment: Rehabilitation  ONSET DATE:     SUBJECTIVE:   SUBJECTIVE STATEMENT: Pt states continued improvements in back and LE. He did some heavier work outside this weekend, and had little pain in back.  Head/neck still tingling. He is going to get an appt at sports med for further assessment.   Pt states continued tingling sensation in neck and into head. Tingling in legs better, but still intermittent. Pain was better last week, and over the weekend. He had better tolerance for driving as well as being able to do some exercise over the weekend without pain.   Eval: Pt states Back pain  for a few years. Worse lastely , due to driving for work . R sided low back pain. No pain on L. Increased pain with sitting/driving, as well as if he tries to sit on floor, in a deep flexed position. Notes much pain when trying to get back up.  Previous sports injuries- heavy lifting- hurt back-  high jump accident- landed on R side, concussions (2) - no residual symptoms from concussions.  Current Exercise: light lifting weights-  less stretching.  Able to do squats without pain  Did have some tingling into bil thighs- improved since taking B12 , but still has some intermittently  Denies any radicular pain  Sleeps in all positions   PERTINENT HISTORY: HTN,    PAIN:  Are you having pain? Yes: NPRS scale: up to 2-3 with driving/10,  1/89 getting up from low bend.  Pain location: R low back  Pain description: sore,  Aggravating factors: sitting, driving,  Relieving factors: walking around, movement,   PRECAUTIONS: None  WEIGHT BEARING RESTRICTIONS: No  FALLS:  Has patient fallen in last 6 months? No   PLOF: Independent  PATIENT GOALS:  Decreased pain in back ,improve sitting/driving tolerance   NEXT MD VISIT:   OBJECTIVE:   DIAGNOSTIC FINDINGS:   PATIENT SURVEYS:    COGNITION: Overall  cognitive status: Within functional limits for tasks assessed     SENSATION: WFL  EDEMA:  n/a   POSTURE:    No Significant postural limitations  PALPATION: Tenderness in Low lumbar L4/5 on R, into R SI.  No tenderness in glute med or piriformis.   LOWER EXTREMITY ROM: LEs: WFL Lumbar: WFL   LOWER EXTREMITY MMT:  MMT Left eval Right  eval  Hip flexion 4+ 4+  Hip extension    Hip abduction 4+ 4+  Hip adduction    Hip internal rotation    Hip external rotation    Knee flexion 5 5  Knee extension 5 5  Ankle dorsiflexion    Ankle plantarflexion    Ankle inversion    Ankle eversion     (Blank rows = not tested)  LOWER EXTREMITY SPECIAL TESTS:  Stork: Neg SLR: Neg   Repeated extensions- standing: comfortable. Improved pain   FUNCTIONAL TESTS:    GAIT: Unremarkable    TODAY'S TREATMENT:                                                                                                                              DATE:   07/13/24 Therapeutic Exercise: Aerobic: Supine:  LTR x 10;   bridging x 10 ; SL bridge x 10 bil;  Prone:  press ups to hands x 15 ;   bird dog, x 10;   in/out 3x 3  Seated:  piriformis stretch x 4 bil;  Standing:   Stretches:  Neuromuscular Re-education: Manual Therapy:   lumbar PA mobs  Therapeutic Activity: Dead lift motion 1 KB 15 lb 2 x 10 for form  SL hinge to chair x 10,  SL deadlift 15lb KB 2 x 3 bil;  Self Care:   07/06/24:  Therapeutic Exercise: Aerobic: Supine:     bridging x 10 ; with hip add squeeze x 15;  Prone:   ; prone press ups  to elbows x 10,  to hands x 15;(and x 10 after exercise)   Seated: reviewed UT and levator stretching for neck pain Standing:   Lumbar side bending/QL stretch x 4 bil;  Stretches:  Neuromuscular Re-education: Manual Therapy:  DTM R lumbar paraspinal, QL,  SI mobs and lumbar PA mobs  Therapeutic Activity: Paloff press double BlueTB x 20 bil;  Self Care:  reviewed pillow, sleeping, and driving positions for neck pain/tingling    06/30/24: Therapeutic Exercise: Aerobic: Supine: LTR x 10;  Supine march with TA- education on core control;    bridging x 15 ;  Prone:   prone on elbows x 1 min;  prone press ups  to elbows x 10,  to hands x 10;(and x 10 after exercise)   Seated: Standing:  extension 2 x 10 after exercise ;    Stretches:  Neuromuscular Re-education: Manual Therapy: DTM R lumbar paraspinal, QL, SI mobs and lumbar PA mobs  Therapeutic Activity: Squat x 10,  x 10  with 20lb- cueing for posture and mechanics Review of TRX squats for back comfort Rows with TA Black TB x 15;  Planks 20 sec x 3  Self Care:     Eval:  Therapeutic  Exercise: Aerobic: Supine: Prone: prone on elbows x 2 min;  prone press ups (pain free) - to elbows 2 x 10 Seated: Standing:  extension x 15 Stretches:  Neuromuscular Re-education: Manual Therapy: Therapeutic Activity: Self Care: Education on seat position, low vs high, for car and work.     PATIENT EDUCATION:  Education details: Updated and reviewed HEP  Person educated: Patient Education method: Explanation, Demonstration, Tactile cues, Verbal cues, and Handouts Education comprehension: verbalized understanding, returned demonstration, verbal cues required, tactile cues required, and needs further education   HOME EXERCISE PROGRAM: Access Code: [email or web address] URL: [email or web address] Date: 06/18/2024 Prepared by: Tinnie Don  Exercises - Prone on Elbows Stretch  - 1-  hold - Prone Press Up On Elbows  - 3-5 x daily - 1-2 sets - 10 reps - 5 sec  hold - Standing Lumbar Extension  - 2-3 x daily - 1-2 sets - 10 reps  ASSESSMENT:  CLINICAL IMPRESSION:   Pt progressing very well with back pain. He has improving mobility, and improving tolerance for exercise and functional activity with much less pain. He will continue to work with lumbar support for comfort in the car. He will benefit from continued mobility and education on strengthening. He will get appt with sports med for ongoing neck tingling.   Eval: Patient presents with primary complaint of  pain in R side of low back. He has symptoms consistent with disc pathology vs SIJ. He has increased pain on R side with sustained flexion, sitting and driving which he is required to do for work. He has no pain with trial for extension today. He has lack of effective HEP for ongoing pain. Pt with decreased ability for full functional activities. Pt will  benefit from skilled PT to improve deficits and pain and to return to PLOF.   OBJECTIVE IMPAIRMENTS: decreased activity tolerance, decreased strength, increased muscle  spasms, improper body mechanics, and pain.   ACTIVITY LIMITATIONS: lifting, bending, sitting, squatting, and locomotion level  PARTICIPATION LIMITATIONS: cleaning, driving, community activity, and occupation  PERSONAL FACTORS: Past/current experiences and Time since onset of injury/illness/exacerbation are also affecting patient's functional outcome.   REHAB POTENTIAL: Good  CLINICAL DECISION MAKING: Stable/uncomplicated  EVALUATION COMPLEXITY: Low   GOALS: Goals reviewed with patient? Yes  SHORT TERM GOALS: Target date: 07/09/2024  Pt to be independent with initial HEP  Goal status: MET  2.  Pt to report standing and doing lumbar extension when getting out of car after driving.   Goal status: INITIAL    LONG TERM GOALS: Target date: 08/13/2024   Pt to be independent with final HEP  Goal status: INITIAL  2.  Pt to report ability for driving at least 1 hour with pain 0-1/10 in back  Goal status: INITIAL  3.  Pt to demo ability for bend, squat, with optimal mechanics and no pain in back, to improve safety with IADLs and work duties   Goal status: INITIAL  4.  Pt to report decreased pain in R low back to 0-1/10 with activity and at least 30 min of exercise.   Goal status: INITIAL    PLAN:  PT FREQUENCY: 1-2x/week  PT DURATION: 8 weeks  PLANNED INTERVENTIONS: Therapeutic exercises, Therapeutic activity, Neuromuscular re-education, Patient/Family education, Self Care, Joint mobilization,  Joint manipulation, Stair training, Orthotic/Fit training, DME instructions, Aquatic Therapy, Dry Needling, Electrical stimulation, Cryotherapy, Moist heat, Taping, Ultrasound, Ionotophoresis 4mg /ml Dexamethasone, Manual therapy,  Vasopneumatic device, Traction, Spinal manipulation, Spinal mobilization,Balance training, Gait training,   PLAN FOR NEXT SESSION:   Tinnie Don, PT, DPT 4:30 PM  07/13/24

## 2024-07-20 ENCOUNTER — Ambulatory Visit: Admitting: Physical Therapy

## 2024-07-20 ENCOUNTER — Encounter: Payer: Self-pay | Admitting: Physical Therapy

## 2024-07-20 DIAGNOSIS — M5459 Other low back pain: Secondary | ICD-10-CM | POA: Diagnosis not present

## 2024-07-20 NOTE — Therapy (Signed)
 OUTPATIENT PHYSICAL THERAPY LOWER EXTREMITY TREATMENT   Patient Name: Bernard Mora MRN: [id number] DOB:[birth year], 48 y.o., male Today's Date: 07/20/24   END OF SESSION:  PT End of Session - 07/20/24 0847     Visit Number 5    Number of Visits 16    Date for Recertification  08/13/24    Authorization Type [employer]    PT Start Time 0848    PT Stop Time 0932    PT Time Calculation (min) 44 min    Activity Tolerance Patient tolerated treatment well    Behavior During Therapy [facility] for tasks assessed/performed           Past Medical History:  Diagnosis Date   Allergy    Anxiety    Arthritis    GERD (gastroesophageal reflux disease)    Hyperlipidemia    Hypertension    Past Surgical History:  Procedure Laterality Date   arm fracture Left    crack ribs Right 07/30/2016   FRACTURE SURGERY Left 1993   wrist   TONSILLECTOMY     Patient Active Problem List   Diagnosis Date Noted   Angioma of skin 03/18/2024   Hemangioma of skin and subcutaneous tissue 03/18/2024   Nevus of left shoulder 03/18/2024   Viral URI with cough 03/22/2023   Hyperhidrosis 08/26/2017   Anxiety 07/03/2017   Costochondral separation 10/24/2016     PCP: Lucie Buttner  REFERRING PROVIDER: Lucie Buttner  REFERRING DIAG: Low back pain   THERAPY DIAG:  Other low back pain  Rationale for Evaluation and Treatment: Rehabilitation  ONSET DATE:     SUBJECTIVE:   SUBJECTIVE STATEMENT: Pt states continued improvements in back and LE. He did some work in garage this weekend, and had a bit of soreness the next day, but no pain down leg.    Eval: Pt states Back pain for a few years. Worse lastely , due to driving for work . R sided low back pain. No pain on L. Increased pain with sitting/driving, as well as if he tries to sit on floor, in a deep flexed position. Notes much pain when trying to get back up.  Previous sports injuries- heavy lifting- hurt back-  high jump accident- landed on R side,  concussions (2) - no residual symptoms from concussions.  Current Exercise: light lifting weights-  less stretching.  Able to do squats without pain  Did have some tingling into bil thighs- improved since taking B12 , but still has some intermittently  Denies any radicular pain  Sleeps in all positions   PERTINENT HISTORY: HTN,    PAIN:  Are you having pain? Yes: NPRS scale: up to 2-3 with driving/10,  1/89 getting up from low bend.  Pain location: R low back  Pain description: sore,  Aggravating factors: sitting, driving,  Relieving factors: walking around, movement,   PRECAUTIONS: None  WEIGHT BEARING RESTRICTIONS: No  FALLS:  Has patient fallen in last 6 months? No   PLOF: Independent  PATIENT GOALS:  Decreased pain in back ,improve sitting/driving tolerance   NEXT MD VISIT:   OBJECTIVE:   DIAGNOSTIC FINDINGS:   PATIENT SURVEYS:    COGNITION: Overall cognitive status: Within functional limits for tasks assessed     SENSATION: WFL  EDEMA:  n/a   POSTURE:    No Significant postural limitations  PALPATION: Tenderness in Low lumbar L4/5 on R, into R SI.  No tenderness in glute med or piriformis.   LOWER EXTREMITY ROM: LEs: WFL Lumbar:  WFL   LOWER EXTREMITY MMT:  MMT Left eval Right  eval  Hip flexion 4+ 4+  Hip extension    Hip abduction 4+ 4+  Hip adduction    Hip internal rotation    Hip external rotation    Knee flexion 5 5  Knee extension 5 5  Ankle dorsiflexion    Ankle plantarflexion    Ankle inversion    Ankle eversion     (Blank rows = not tested)  LOWER EXTREMITY SPECIAL TESTS:  Stork: Neg SLR: Neg  Repeated extensions- standing: comfortable. Improved pain   FUNCTIONAL TESTS:    GAIT: Unremarkable    TODAY'S TREATMENT:                                                                                                                              DATE:   07/20/24 Therapeutic Exercise: Aerobic: Bike L3 x 7 min  Supine:   LTR x 10;   bridging x 10 ;  SL bridge 3 x 5 bil;   SLR 3 x 5 bil with TA;  Education and cueing for achieving TA contraction;   Prone:    Seated:  piriformis stretch x 4 bil;  Standing:   Stretches:    Neuromuscular Re-education: Manual Therapy:   LAD bil lumbar  Therapeutic Activity: Squats 25 lb x 10,   45 lb 2 x 8 (heels elevated)  Dead lift motion 45 lb 2 x 10  SL hinge to chair 3 x 5 bil,   Self Care:    07/13/24 Therapeutic Exercise: Aerobic:  Supine:  LTR x 10;   bridging x 10 ;  SL bridge x 10 bil;  Prone:  press ups to hands x 15 ;   bird dog, x 10;   in/out 3x 3  Seated:  piriformis stretch x 4 bil;  Standing:   Stretches:  Neuromuscular Re-education: Manual Therapy:   lumbar PA mobs  Therapeutic Activity: Dead lift motion 1 KB 15 lb 2 x 10 for form  SL hinge to chair x 10,  SL deadlift 15lb KB 2 x 3 bil;  Self Care:   07/06/24:  Therapeutic Exercise: Aerobic: Supine:     bridging x 10 ; with hip add squeeze x 15;  Prone:   ; prone press ups  to elbows x 10,  to hands x 15;(and x 10 after exercise)   Seated: reviewed UT and levator stretching for neck pain Standing:   Lumbar side bending/QL stretch x 4 bil;  Stretches:  Neuromuscular Re-education: Manual Therapy:  DTM R lumbar paraspinal, QL,  SI mobs and lumbar PA mobs  Therapeutic Activity: Paloff press double BlueTB x 20 bil;  Self Care:  reviewed pillow, sleeping, and driving positions for neck pain/tingling    06/30/24: Therapeutic Exercise: Aerobic: Supine: LTR x 10;  Supine march with TA- education on core control;    bridging x 15 ;  Prone:   prone on  elbows x 1 min;  prone press ups  to elbows x 10,  to hands x 10;(and x 10 after exercise)   Seated: Standing:  extension 2 x 10 after exercise ;    Stretches:  Neuromuscular Re-education: Manual Therapy: DTM R lumbar paraspinal, QL, SI mobs and lumbar PA mobs  Therapeutic Activity: Squat x 10,  x 10 with 20lb- cueing for posture and  mechanics Review of TRX squats for back comfort Rows with TA Black TB x 15;  Planks 20 sec x 3  Self Care:     Eval:  Therapeutic Exercise: Aerobic: Supine: Prone: prone on elbows x 2 min;  prone press ups (pain free) - to elbows 2 x 10 Seated: Standing:  extension x 15 Stretches:  Neuromuscular Re-education: Manual Therapy: Therapeutic Activity: Self Care: Education on seat position, low vs high, for car and work.     PATIENT EDUCATION:  Education details: Updated and reviewed HEP  Person educated: Patient Education method: Explanation, Demonstration, Tactile cues, Verbal cues, and Handouts Education comprehension: verbalized understanding, returned demonstration, verbal cues required, tactile cues required, and needs further education   HOME EXERCISE PROGRAM: Access Code: [email or web address] URL: [email or web address] Date: 06/18/2024 Prepared by: Tinnie Don  Exercises - Prone on Elbows Stretch  - 1-  hold - Prone Press Up On Elbows  - 3-5 x daily - 1-2 sets - 10 reps - 5 sec  hold - Standing Lumbar Extension  - 2-3 x daily - 1-2 sets - 10 reps  ASSESSMENT:  CLINICAL IMPRESSION:   Pt progressing well with  pain and strengthening. Cueing for core contraction and control with most exercises today. Pt to benefit from continued care.   Eval: Patient presents with primary complaint of  pain in R side of low back. He has symptoms consistent with disc pathology vs SIJ. He has increased pain on R side with sustained flexion, sitting and driving which he is required to do for work. He has no pain with trial for extension today. He has lack of effective HEP for ongoing pain. Pt with decreased ability for full functional activities. Pt will  benefit from skilled PT to improve deficits and pain and to return to PLOF.   OBJECTIVE IMPAIRMENTS: decreased activity tolerance, decreased strength, increased muscle spasms, improper body mechanics, and pain.   ACTIVITY  LIMITATIONS: lifting, bending, sitting, squatting, and locomotion level  PARTICIPATION LIMITATIONS: cleaning, driving, community activity, and occupation  PERSONAL FACTORS: Past/current experiences and Time since onset of injury/illness/exacerbation are also affecting patient's functional outcome.   REHAB POTENTIAL: Good  CLINICAL DECISION MAKING: Stable/uncomplicated  EVALUATION COMPLEXITY: Low   GOALS: Goals reviewed with patient? Yes  SHORT TERM GOALS: Target date: 07/09/2024  Pt to be independent with initial HEP  Goal status: MET  2.  Pt to report standing and doing lumbar extension when getting out of car after driving.   Goal status: INITIAL    LONG TERM GOALS: Target date: 08/13/2024   Pt to be independent with final HEP  Goal status: INITIAL  2.  Pt to report ability for driving at least 1 hour with pain 0-1/10 in back  Goal status: INITIAL  3.  Pt to demo ability for bend, squat, with optimal mechanics and no pain in back, to improve safety with IADLs and work duties   Goal status: INITIAL  4.  Pt to report decreased pain in R low back to 0-1/10 with activity and at least 30 min of exercise.   Goal  status: INITIAL    PLAN:  PT FREQUENCY: 1-2x/week  PT DURATION: 8 weeks  PLANNED INTERVENTIONS: Therapeutic exercises, Therapeutic activity, Neuromuscular re-education, Patient/Family education, Self Care, Joint mobilization, Joint manipulation, Stair training, Orthotic/Fit training, DME instructions, Aquatic Therapy, Dry Needling, Electrical stimulation, Cryotherapy, Moist heat, Taping, Ultrasound, Ionotophoresis 4mg /ml Dexamethasone, Manual therapy,  Vasopneumatic device, Traction, Spinal manipulation, Spinal mobilization,Balance training, Gait training,   PLAN FOR NEXT SESSION:   Tinnie Don, PT, DPT 8:48 AM  07/20/24

## 2024-07-21 NOTE — Progress Notes (Unsigned)
 Ben [name] [address] [place] [address] Phone: [phone]   Assessment and Plan:     1. Neck pain (Primary) 2. Facial tingling -Chronic, unchanged, initial visit - Patient experiencing 3+ months of numbness and tingling originally radiating from neck and now occurring over bilateral cheek, chin.  Not present on forehead. - Differential includes trigeminal neuralgia and could refer patient for neurology evaluation if no improvement by follow-up visit - Will workup for musculoskeletal cause with C-spine x-ray, starting HEP for neck, and follow-up in 6 weeks for reevaluation.  Low likelihood the patient's symptoms are musculoskeletal in cause based on no MOI, no worsening with physical activity, abnormal neurologic pattern from musculoskeletal etiology - Patient symptoms could be related to changing timing of Zoloft  medication.  Patient previously was taking Zoloft  and blood pressure medication in the morning until he switched medications into the evening 3 months ago.  This is about the same time that numbness and tingling sensation started.  Recommend continuing to take blood pressure medication in the evening, but change to taking Zoloft  in the morning - Patient drinks 2-3 light beers nightly, with increased 5-6 beverages on the weekends.  I doubt that there is an association with patient's drinking pattern with his numbness and tingling, but recommend patient stop drinking for 1 week to see if symptoms improve - Patient's vitamin B12 was within normal limits, but mildly low normal.  Patient has been taking vitamin B12 without change in symptoms.  Recommend continuing vitamin B12  15 additional minutes spent for educating Therapeutic Home Exercise Program.  This included exercises focusing on stretching, strengthening, with focus on eccentric aspects.   Long term goals include an improvement in range of motion, strength, endurance as  well as avoiding reinjury. Patient's frequency would include in 1-2 times a day, 3-5 times a week for a duration of 6-12 weeks. Proper technique shown and discussed handout in great detail with ATC.  All questions were discussed and answered.    3. Chronic bilateral low back pain with right-sided sciatica -Chronic with exacerbation, initial visit - Most consistent with flare of lumbar degenerative changes with mild osteophyte formation at L2-L3 and L3-L4 contributing to low back pain and intermittent right sided radicular symptoms - Patient is improving with physical therapy.  Recommend continuing physical therapy    Pertinent previous records reviewed include family medicine note   Follow Up: 6 weeks for reevaluation.  If no improvement or worsening of symptoms, could discuss C-spine MRI versus neurology referral   Subjective:   I, Bernard Mora, am serving as a neurosurgeon for Doctor Morene Mace  Chief Complaint: Neck and upper back pain   HPI:   07/22/2024 Patient is a 48 year old male presenting to [facility] for neck and back pain. Patient referred to Sports medicine by Dr. Job. Patient has been seeing physical therapy and has been helping lower back but the upper back and neck pain is getting worse. The numbness and tingling is now radiating up the head and around to his face. Low back x ray done in October but no neck or thoracic x ray done that I can see. States he has tingling that radiates through his face and back of the head. Low back pain that radiates up to his neck and head. Pain in the low back has been improving since PT for about 4 weeks. No neck pain. PCP thinks or thought it was B12 deficiency .  No numbness or tingling.    Relevant Historical Information: None pertinent  Additional pertinent review of systems negative.   Current Outpatient Medications:    amLODipine  (NORVASC ) 2.5 MG tablet, Take 1 tablet (2.5 mg total) by mouth daily., Disp: 90  tablet, Rfl: 1   azelastine  (ASTELIN ) 0.1 % nasal spray, Place 2 sprays into both nostrils 2 (two) times daily. Use in each nostril as directed, Disp: 30 mL, Rfl: 12   fluticasone (FLONASE  SENSIMIST) 27.5 MCG/SPRAY nasal spray, Place 2 sprays into the nose in the morning and at bedtime., Disp: 10 g, Rfl: 12   ibuprofen (ADVIL,MOTRIN) 200 MG tablet, Take 200-600 mg by mouth as needed., Disp: , Rfl:    Multiple Vitamin (MULTIVITAMIN) tablet, Take 1 tablet by mouth as needed., Disp: , Rfl:    sertraline  (ZOLOFT ) 50 MG tablet, Take 0.5 tablets (25 mg total) by mouth daily., Disp: , Rfl:   Current Facility-Administered Medications:    0.9 %  sodium chloride  infusion, 500 mL, Intravenous, Once, Stacia Hamilton E, MD   Objective:     Vitals:   07/22/24 1048  BP: 130/84  Pulse: 61  SpO2: 97%  Weight: 209 lb (94.8 kg)  Height: 6' (1.829 m)      Body mass index is 28.35 kg/m.    Physical Exam:    Gen: Appears well, nad, nontoxic and pleasant Psych: Alert and oriented, appropriate mood and affect Neuro: sensation intact, strength is 5/5 in upper and lower extremities, muscle tone wnl.  Sensation intact along bilateral forehead, bilateral cheek, bilateral chin. Skin: no susupicious lesions or rashes  Neck Exam: Cervical Spine- Posture normal Skin- normal, intact  Neuro:  Strength-  Right Left   Deltoid (C5) 5/5 5/5  Bicep/Brachioradialis (C5/6) 5/5  5/5  Wrist Extension (C6) 5/5 5/5  Tricep (C7) 5/5 5/5  Wrist Flexion (C7) 5/5 5/5  Grip (C8) 5/5 5/5  Finger Abduction (T1) 5/5 5/5   Sensation: intact to light touch in upper extremities bilaterally  Spurling's:  negative bilaterally Neck ROM: Full active ROM NTTP: cervical spinous processes, cervical paraspinal, thoracic paraspinal, trapezius   Back - Normal skin, Spine with normal alignment and no deformity.   No tenderness to vertebral process palpation.   Paraspinous muscles are not tender and without spasm NTTP gluteal  musculature Straight leg raise negative Trendelenberg negative Piriformis Test negative Gait normal  Low back pain with lumbar flexion.  No pain with extension or rotation  Electronically signed by:  Odis Mace D.[facility] 11:36 AM 07/22/24

## 2024-07-22 ENCOUNTER — Ambulatory Visit

## 2024-07-22 ENCOUNTER — Ambulatory Visit: Admitting: Sports Medicine

## 2024-07-22 VITALS — BP 130/84 | HR 61 | Ht 72.0 in | Wt 209.0 lb

## 2024-07-22 DIAGNOSIS — M5441 Lumbago with sciatica, right side: Secondary | ICD-10-CM

## 2024-07-22 DIAGNOSIS — R202 Paresthesia of skin: Secondary | ICD-10-CM

## 2024-07-22 DIAGNOSIS — M542 Cervicalgia: Secondary | ICD-10-CM

## 2024-07-22 DIAGNOSIS — G8929 Other chronic pain: Secondary | ICD-10-CM

## 2024-07-22 NOTE — Patient Instructions (Addendum)
 Recommend moving Zoloft  to the morning, while keep taking blood pressure at night  Neck HEP   Xrays on the way out   Recommend taking 1 week off of all drinking  6 week follow up

## 2024-07-27 ENCOUNTER — Encounter: Payer: Self-pay | Admitting: Physical Therapy

## 2024-07-27 ENCOUNTER — Ambulatory Visit: Admitting: Physical Therapy

## 2024-07-27 DIAGNOSIS — M5459 Other low back pain: Secondary | ICD-10-CM | POA: Diagnosis not present

## 2024-07-27 NOTE — Therapy (Signed)
 OUTPATIENT PHYSICAL THERAPY LOWER EXTREMITY TREATMENT   Patient Name: Bernard Mora MRN: [id number] DOB:1976/08/09, 48 y.o., male Today's Date: 07/27/24   END OF SESSION:  PT End of Session - 07/27/24 0848     Visit Number 6    Number of Visits 16    Date for Recertification  08/13/24    Authorization Type [employer]    PT Start Time [phone]    PT Stop Time 0930    PT Time Calculation (min) 41 min    Activity Tolerance Patient tolerated treatment well    Behavior During Therapy [facility] for tasks assessed/performed           Past Medical History:  Diagnosis Date   Allergy    Anxiety    Arthritis    GERD (gastroesophageal reflux disease)    Hyperlipidemia    Hypertension    Past Surgical History:  Procedure Laterality Date   arm fracture Left    crack ribs Right 07/30/2016   FRACTURE SURGERY Left 1993   wrist   TONSILLECTOMY     Patient Active Problem List   Diagnosis Date Noted   Angioma of skin 03/18/2024   Hemangioma of skin and subcutaneous tissue 03/18/2024   Nevus of left shoulder 03/18/2024   Viral URI with cough 03/22/2023   Hyperhidrosis 08/26/2017   Anxiety 07/03/2017   Costochondral separation 10/24/2016     PCP: Lucie Buttner  REFERRING PROVIDER: Lucie Buttner  REFERRING DIAG: Low back pain   THERAPY DIAG:  Other low back pain  Rationale for Evaluation and Treatment: Rehabilitation  ONSET DATE:     SUBJECTIVE:   SUBJECTIVE STATEMENT: Pt fell down step/slipped on Friday night, outside, fell onto R low thoracic/low back, twisted and fell. He has increased soreness on R low thoracic, QL region, with mild bruising. Soreness has been improving each day.    Eval: Pt states Back pain for a few years. Worse lastely , due to driving for work . R sided low back pain. No pain on L. Increased pain with sitting/driving, as well as if he tries to sit on floor, in a deep flexed position. Notes much pain when trying to get back up.  Previous sports  injuries- heavy lifting- hurt back-  high jump accident- landed on R side, concussions (2) - no residual symptoms from concussions.  Current Exercise: light lifting weights-  less stretching.  Able to do squats without pain  Did have some tingling into bil thighs- improved since taking B12 , but still has some intermittently  Denies any radicular pain  Sleeps in all positions   PERTINENT HISTORY: HTN,    PAIN:  Are you having pain? Yes: NPRS scale: up to 2-3 with driving/10,  1/89 getting up from low bend.  Pain location: R low back  Pain description: sore,  Aggravating factors: sitting, driving,  Relieving factors: walking around, movement,   PRECAUTIONS: None  WEIGHT BEARING RESTRICTIONS: No  FALLS:  Has patient fallen in last 6 months? No   PLOF: Independent  PATIENT GOALS:  Decreased pain in back ,improve sitting/driving tolerance   NEXT MD VISIT:   OBJECTIVE:   DIAGNOSTIC FINDINGS:   PATIENT SURVEYS:    COGNITION: Overall cognitive status: Within functional limits for tasks assessed     SENSATION: WFL  EDEMA:  n/a   POSTURE:    No Significant postural limitations  PALPATION: Tenderness in Low lumbar L4/5 on R, into R SI.  No tenderness in glute med or piriformis.  LOWER EXTREMITY ROM: LEs: WFL Lumbar: WFL   LOWER EXTREMITY MMT:  MMT Left eval Right  eval  Hip flexion 4+ 4+  Hip extension    Hip abduction 4+ 4+  Hip adduction    Hip internal rotation    Hip external rotation    Knee flexion 5 5  Knee extension 5 5  Ankle dorsiflexion    Ankle plantarflexion    Ankle inversion    Ankle eversion     (Blank rows = not tested)  LOWER EXTREMITY SPECIAL TESTS:  Stork: Neg SLR: Neg  Repeated extensions- standing: comfortable. Improved pain   FUNCTIONAL TESTS:    GAIT: Unremarkable    TODAY'S TREATMENT:                                                                                                                              DATE:    07/27/24: Therapeutic Exercise: Aerobic: Bike L2 x 8 min  Supine:   SLR 2 x 10 bil with TA;   Prone:    Seated:  Standing:   Stretches:  Seated HSS x 3 bil;   Cat/cow x 15 ; S/L QL stretch, standing QL stretch,  childs pose , seated lumbar flexion x 3; Neuromuscular Re-education: Manual Therapy:   LAD bil lumbar,   Therapeutic Activity: Self Care: Modalities: moist heat pack to thoracic/lumbar at end of session x 10 min     07/20/24 Therapeutic Exercise: Aerobic: Bike L3 x 7 min  Supine:  LTR x 10;   bridging x 10 ;  SL bridge 3 x 5 bil;   SLR 3 x 5 bil with TA;  Education and cueing for achieving TA contraction;   Prone:    Seated:  piriformis stretch x 4 bil;  Standing:   Stretches:    Neuromuscular Re-education: Manual Therapy:   LAD bil lumbar  Therapeutic Activity: Squats 25 lb x 10,   45 lb 2 x 8 (heels elevated)  Dead lift motion 45 lb 2 x 10  SL hinge to chair 3 x 5 bil,   Self Care:    07/13/24 Therapeutic Exercise: Aerobic:  Supine:  LTR x 10;   bridging x 10 ;  SL bridge x 10 bil;  Prone:  press ups to hands x 15 ;   bird dog, x 10;   in/out 3x 3  Seated:  piriformis stretch x 4 bil;  Standing:   Stretches:  Neuromuscular Re-education: Manual Therapy:   lumbar PA mobs  Therapeutic Activity: Dead lift motion 1 KB 15 lb 2 x 10 for form  SL hinge to chair x 10,  SL deadlift 15lb KB 2 x 3 bil;  Self Care:   07/06/24:  Therapeutic Exercise: Aerobic: Supine:     bridging x 10 ; with hip add squeeze x 15;  Prone:   ; prone press ups  to elbows x 10,  to hands x 15;(and x 10 after exercise)  Seated: reviewed UT and levator stretching for neck pain Standing:   Lumbar side bending/QL stretch x 4 bil;  Stretches:  Neuromuscular Re-education: Manual Therapy:  DTM R lumbar paraspinal, QL,  SI mobs and lumbar PA mobs  Therapeutic Activity: Paloff press double BlueTB x 20 bil;  Self Care:  reviewed pillow, sleeping, and driving positions for neck  pain/tingling     PATIENT EDUCATION:  Education details: Updated and reviewed HEP  Person educated: Patient Education method: Explanation, Demonstration, Tactile cues, Verbal cues, and Handouts Education comprehension: verbalized understanding, returned demonstration, verbal cues required, tactile cues required, and needs further education   HOME EXERCISE PROGRAM: Access Code: [email or web address] URL: [email or web address] Date: 06/18/2024 Prepared by: Tinnie Don  Exercises - Prone on Elbows Stretch  - 1-  hold - Prone Press Up On Elbows  - 3-5 x daily - 1-2 sets - 10 reps - 5 sec  hold - Standing Lumbar Extension  - 2-3 x daily - 1-2 sets - 10 reps  ASSESSMENT:  CLINICAL IMPRESSION:   Pt with increased soreness today in R low thoracic, lumbar, and ribs 11/12 from fall. He is able to take deep breath with soreness, but no significant or sharp pain, more pain with twisting motions. Reviewed activities to continue and to avoid at this time over the next several days to a week. He feels pain is improving, but will call MD for possible rib x-ray if things worsen.   Eval: Patient presents with primary complaint of  pain in R side of low back. He has symptoms consistent with disc pathology vs SIJ. He has increased pain on R side with sustained flexion, sitting and driving which he is required to do for work. He has no pain with trial for extension today. He has lack of effective HEP for ongoing pain. Pt with decreased ability for full functional activities. Pt will  benefit from skilled PT to improve deficits and pain and to return to PLOF.   OBJECTIVE IMPAIRMENTS: decreased activity tolerance, decreased strength, increased muscle spasms, improper body mechanics, and pain.   ACTIVITY LIMITATIONS: lifting, bending, sitting, squatting, and locomotion level  PARTICIPATION LIMITATIONS: cleaning, driving, community activity, and occupation  PERSONAL FACTORS: Past/current  experiences and Time since onset of injury/illness/exacerbation are also affecting patient's functional outcome.   REHAB POTENTIAL: Good  CLINICAL DECISION MAKING: Stable/uncomplicated  EVALUATION COMPLEXITY: Low   GOALS: Goals reviewed with patient? Yes  SHORT TERM GOALS: Target date: 07/09/2024  Pt to be independent with initial HEP  Goal status: MET  2.  Pt to report standing and doing lumbar extension when getting out of car after driving.   Goal status: INITIAL    LONG TERM GOALS: Target date: 08/13/2024   Pt to be independent with final HEP  Goal status: INITIAL  2.  Pt to report ability for driving at least 1 hour with pain 0-1/10 in back  Goal status: INITIAL  3.  Pt to demo ability for bend, squat, with optimal mechanics and no pain in back, to improve safety with IADLs and work duties   Goal status: INITIAL  4.  Pt to report decreased pain in R low back to 0-1/10 with activity and at least 30 min of exercise.   Goal status: INITIAL   PLAN:  PT FREQUENCY: 1-2x/week  PT DURATION: 8 weeks  PLANNED INTERVENTIONS: Therapeutic exercises, Therapeutic activity, Neuromuscular re-education, Patient/Family education, Self Care, Joint mobilization, Joint manipulation, Stair training, Orthotic/Fit training, DME instructions, Aquatic Therapy, Dry Needling, Electrical  stimulation, Cryotherapy, Moist heat, Taping, Ultrasound, Ionotophoresis 4mg /ml Dexamethasone, Manual therapy,  Vasopneumatic device, Traction, Spinal manipulation, Spinal mobilization,Balance training, Gait training,   PLAN FOR NEXT SESSION:   Tinnie Don, PT, DPT 8:48 AM  07/27/24

## 2024-07-29 ENCOUNTER — Ambulatory Visit: Payer: Self-pay | Admitting: Sports Medicine

## 2024-08-03 ENCOUNTER — Ambulatory Visit: Admitting: Physical Therapy

## 2024-08-03 ENCOUNTER — Encounter: Payer: Self-pay | Admitting: Physical Therapy

## 2024-08-03 DIAGNOSIS — M5459 Other low back pain: Secondary | ICD-10-CM | POA: Diagnosis not present

## 2024-08-03 NOTE — Therapy (Signed)
 OUTPATIENT PHYSICAL THERAPY LOWER EXTREMITY TREATMENT   Patient Name: Bernard Mora MRN: [id number] DOB:June 21, 1976, 48 y.o., male Today's Date: 08/03/2024   END OF SESSION:  PT End of Session - 08/03/24 0854     Visit Number 7    Number of Visits 16    Date for Recertification  08/13/24    Authorization Type [employer]    PT Start Time 0848    PT Stop Time 0930    PT Time Calculation (min) 42 min    Activity Tolerance Patient tolerated treatment well    Behavior During Therapy [name] for tasks assessed/performed            Past Medical History:  Diagnosis Date   Allergy    Anxiety    Arthritis    GERD (gastroesophageal reflux disease)    Hyperlipidemia    Hypertension    Past Surgical History:  Procedure Laterality Date   arm fracture Left    crack ribs Right 07/30/2016   FRACTURE SURGERY Left 1993   wrist   TONSILLECTOMY     Patient Active Problem List   Diagnosis Date Noted   Angioma of skin 03/18/2024   Hemangioma of skin and subcutaneous tissue 03/18/2024   Nevus of left shoulder 03/18/2024   Viral URI with cough 03/22/2023   Hyperhidrosis 08/26/2017   Anxiety 07/03/2017   Costochondral separation 10/24/2016     PCP: Lucie Buttner  REFERRING PROVIDER: Lucie Buttner  REFERRING DIAG: Low back pain   THERAPY DIAG:  Other low back pain  Rationale for Evaluation and Treatment: Rehabilitation  ONSET DATE:     SUBJECTIVE:   SUBJECTIVE STATEMENT:  Pt states improving pain in back from fall. R side of lower rib still sore with turning.    Eval: Pt states Back pain for a few years. Worse lastely , due to driving for work . R sided low back pain. No pain on L. Increased pain with sitting/driving, as well as if he tries to sit on floor, in a deep flexed position. Notes much pain when trying to get back up.  Previous sports injuries- heavy lifting- hurt back-  high jump accident- landed on R side, concussions (2) - no residual symptoms from concussions.   Current Exercise: light lifting weights-  less stretching.  Able to do squats without pain  Did have some tingling into bil thighs- improved since taking B12 , but still has some intermittently  Denies any radicular pain  Sleeps in all positions   PERTINENT HISTORY: HTN,    PAIN:  Are you having pain? Yes: NPRS scale: up to 2-3 with driving/10,  1/89 getting up from low bend.  Pain location: R low back  Pain description: sore,  Aggravating factors: sitting, driving,  Relieving factors: walking around, movement,   PRECAUTIONS: None  WEIGHT BEARING RESTRICTIONS: No  FALLS:  Has patient fallen in last 6 months? No   PLOF: Independent  PATIENT GOALS:  Decreased pain in back ,improve sitting/driving tolerance   NEXT MD VISIT:   OBJECTIVE:   DIAGNOSTIC FINDINGS:   PATIENT SURVEYS:    COGNITION: Overall cognitive status: Within functional limits for tasks assessed     SENSATION: WFL  EDEMA:  n/a   POSTURE:    No Significant postural limitations  PALPATION: Tenderness in Low lumbar L4/5 on R, into R SI.  No tenderness in glute med or piriformis.   LOWER EXTREMITY ROM: LEs: WFL Lumbar: WFL   LOWER EXTREMITY MMT:  MMT Left eval Right  eval  Hip flexion 4+ 4+  Hip extension    Hip abduction 4+ 4+  Hip adduction    Hip internal rotation    Hip external rotation    Knee flexion 5 5  Knee extension 5 5  Ankle dorsiflexion    Ankle plantarflexion    Ankle inversion    Ankle eversion     (Blank rows = not tested)  LOWER EXTREMITY SPECIAL TESTS:  Stork: Neg SLR: Neg  Repeated extensions- standing: comfortable. Improved pain   FUNCTIONAL TESTS:    GAIT: Unremarkable    TODAY'S TREATMENT:                                                                                                                              DATE:    08/03/24: Therapeutic Exercise: Aerobic: Bike L3 x 8 min  Supine:   SLR 2 x 10 bil with TA;   bicycle (high) for core  strength 2 x 10;  Prone:    Seated:  Standing:   Stretches:    Thread the needle x 10;  Cat/cow x 15 ;   childs pose ,  Manual Therapy:   LAD bil lumbar,   Therapeutic Activity: Squats  20 (heels elevated) x 20;  Ankle glides on step x 20 bil  Weighted unilateral hold/carry 25lb with marching x 4  Self Care: Modalities:     07/27/24: Therapeutic Exercise: Aerobic: Bike L2 x 8 min  Supine:   SLR 2 x 10 bil with TA;   Prone:    Seated:  Standing:   Stretches:  Seated HSS x 3 bil;   Cat/cow x 15 ; S/L QL stretch, standing QL stretch,  childs pose , seated lumbar flexion x 3; Neuromuscular Re-education: Manual Therapy:   LAD bil lumbar,   Therapeutic Activity: Self Care: Modalities: moist heat pack to thoracic/lumbar at end of session x 10 min     07/20/24 Therapeutic Exercise: Aerobic: Bike L3 x 7 min  Supine:  LTR x 10;   bridging x 10 ;  SL bridge 3 x 5 bil;   SLR 3 x 5 bil with TA;  Education and cueing for achieving TA contraction;   Prone:    Seated:  piriformis stretch x 4 bil;  Standing:   Stretches:    Neuromuscular Re-education: Manual Therapy:   LAD bil lumbar  Therapeutic Activity: Squats 25 lb x 10,   45 lb 2 x 8 (heels elevated)  Dead lift motion 45 lb 2 x 10  SL hinge to chair 3 x 5 bil,   Self Care:   PATIENT EDUCATION:  Education details: Updated and reviewed HEP  Person educated: Patient Education method: Explanation, Demonstration, Tactile cues, Verbal cues, and Handouts Education comprehension: verbalized understanding, returned demonstration, verbal cues required, tactile cues required, and needs further education   HOME EXERCISE PROGRAM: Access Code: [email or web address] URL: [email or web address] Date: 06/18/2024 Prepared by: Tinnie Don  Exercises - Prone  on Elbows Stretch  - 1-  hold - Prone Press Up On Elbows  - 3-5 x daily - 1-2 sets - 10 reps - 5 sec  hold - Standing Lumbar Extension  - 2-3 x daily - 1-2 sets - 10  reps  ASSESSMENT:  CLINICAL IMPRESSION:    Pt with improved soreness from fall since last visit. Advised to not over do activity, but he is able to do most regular activities and light strengthening without pain. He is progressing very well. Continued core strength and stability today. Pt to be seen for 1-3 more visits, to finalize HEP and ensure low pain from recent fall, and will work towards d/c.   Eval: Patient presents with primary complaint of  pain in R side of low back. He has symptoms consistent with disc pathology vs SIJ. He has increased pain on R side with sustained flexion, sitting and driving which he is required to do for work. He has no pain with trial for extension today. He has lack of effective HEP for ongoing pain. Pt with decreased ability for full functional activities. Pt will  benefit from skilled PT to improve deficits and pain and to return to PLOF.   OBJECTIVE IMPAIRMENTS: decreased activity tolerance, decreased strength, increased muscle spasms, improper body mechanics, and pain.   ACTIVITY LIMITATIONS: lifting, bending, sitting, squatting, and locomotion level  PARTICIPATION LIMITATIONS: cleaning, driving, community activity, and occupation  PERSONAL FACTORS: Past/current experiences and Time since onset of injury/illness/exacerbation are also affecting patient's functional outcome.   REHAB POTENTIAL: Good  CLINICAL DECISION MAKING: Stable/uncomplicated  EVALUATION COMPLEXITY: Low   GOALS: Goals reviewed with patient? Yes  SHORT TERM GOALS: Target date: 07/09/2024  Pt to be independent with initial HEP  Goal status: MET  2.  Pt to report standing and doing lumbar extension when getting out of car after driving.   Goal status: MET    LONG TERM GOALS: Target date: 08/13/2024   Pt to be independent with final HEP  Goal status: INITIAL  2.  Pt to report ability for driving at least 1 hour with pain 0-1/10 in back  Goal status: INITIAL  3.   Pt to demo ability for bend, squat, with optimal mechanics and no pain in back, to improve safety with IADLs and work duties   Goal status: INITIAL  4.  Pt to report decreased pain in R low back to 0-1/10 with activity and at least 30 min of exercise.   Goal status: INITIAL   PLAN:  PT FREQUENCY: 1-2x/week  PT DURATION: 8 weeks  PLANNED INTERVENTIONS: Therapeutic exercises, Therapeutic activity, Neuromuscular re-education, Patient/Family education, Self Care, Joint mobilization, Joint manipulation, Stair training, Orthotic/Fit training, DME instructions, Aquatic Therapy, Dry Needling, Electrical stimulation, Cryotherapy, Moist heat, Taping, Ultrasound, Ionotophoresis 4mg /ml Dexamethasone, Manual therapy,  Vasopneumatic device, Traction, Spinal manipulation, Spinal mobilization,Balance training, Gait training,   PLAN FOR NEXT SESSION:   Tinnie Don, PT, DPT 8:55 AM  08/03/2024

## 2024-08-10 ENCOUNTER — Encounter: Admitting: Physical Therapy

## 2024-08-17 ENCOUNTER — Encounter: Admitting: Physical Therapy

## 2024-08-31 ENCOUNTER — Ambulatory Visit: Payer: Self-pay

## 2024-08-31 ENCOUNTER — Ambulatory Visit: Admitting: Internal Medicine

## 2024-08-31 VITALS — BP 136/88 | HR 79 | Temp 98.4°F | Ht 72.0 in | Wt 211.0 lb

## 2024-08-31 DIAGNOSIS — J019 Acute sinusitis, unspecified: Secondary | ICD-10-CM | POA: Diagnosis not present

## 2024-08-31 MED ORDER — HYDROCODONE BIT-HOMATROP MBR 5-1.5 MG/5ML PO SOLN: 5.0000 mL | Freq: Three times a day (TID) | ORAL | 0 refills | Status: DC | PRN

## 2024-08-31 MED ORDER — AMOXICILLIN-POT CLAVULANATE 875-125 MG PO TABS: 1.0000 | ORAL_TABLET | Freq: Two times a day (BID) | ORAL | 0 refills | Status: AC

## 2024-08-31 NOTE — Telephone Encounter (Signed)
 Noted, appt today.

## 2024-08-31 NOTE — Progress Notes (Signed)
 "   Subjective:    Patient ID: Bernard Mora, male    DOB: 1976-05-25, 49 y.o.   MRN: [id number]      HPI [name] is here for  Chief Complaint  Patient presents with   Nasal Congestion    Lots of mucus; feels like it has settled down in his throat (green mucus this morning) Can't stop coughing and interfering with work. Someone was positive for flu last week; He has been since for a few weeks; Feels lots of sinus pressure when he wakes up in the mornings.    Discussed the use of AI scribe software for clinical note transcription with the patient, who gave verbal consent to proceed.  History of Present Illness Bernard Mora is a 49 year old male who presents with persistent nasal congestion and throat symptoms.  He has been experiencing symptoms for at least three weeks, which began after a trip to the mountains in [place]. Initially, he noticed nasal congestion and drainage from one nostril, which progressively worsened. He attempted self-care with over-the-counter medications, humidifiers, and saline sprays, which provided some relief. However, after feeling better for a few days, he experienced a relapse with symptoms affecting his throat, causing persistent coughing and impacting his ability to sleep and work.  He reports congestion, drainage, and throat irritation affecting his voice. No fever, despite recent exposure to flu in the household. He monitors his temperature regularly and reports no fever. The symptoms have been persistent, with nasal drainage worsening when lying down, and he has observed some color in the mucus when using saline spray.  No shortness of breath, wheezing, significant sneezing, or body aches. He mentions occasional headaches, likely due to coughing, and blocked ears with popping sensations. He is currently tapering off Zoloft , which he suspects may contribute to some headaches and fatigue.  He has been using over-the-counter saline nasal sprays regularly, which he finds  effective in managing his symptoms. He has a history of using these sprays for about two months following an ENT consultation for epistaxis, which has since resolved.       Medications and allergies reviewed with patient and updated if appropriate.  Medications Ordered Prior to Encounter[1]  Review of Systems  Constitutional:  Negative for fever.  HENT:  Positive for congestion, postnasal drip, rhinorrhea, sinus pressure, sneezing (a little), sore throat and voice change. Negative for sinus pain.   Respiratory:  Positive for cough (discolored mucus). Negative for shortness of breath and wheezing.   Neurological:  Positive for headaches (mild from coughing).       Objective:   Vitals:   08/31/24 1456  BP: 136/88  Pulse: 79  Temp: 98.4 F (36.9 C)  SpO2: 98%   BP Readings from Last 3 Encounters:  08/31/24 136/88  07/22/24 130/84  06/02/24 132/80   Wt Readings from Last 3 Encounters:  08/31/24 211 lb (95.7 kg)  07/22/24 209 lb (94.8 kg)  06/02/24 207 lb 12.8 oz (94.3 kg)   Body mass index is 28.62 kg/m.    Physical Exam Constitutional:      General: He is not in acute distress.    Appearance: Normal appearance. He is not ill-appearing.  HENT:     Head: Normocephalic.     Right Ear: Tympanic membrane, ear canal and external ear normal. There is no impacted cerumen.     Left Ear: Tympanic membrane, ear canal and external ear normal. There is no impacted cerumen.     Mouth/Throat:  Mouth: Mucous membranes are moist.     Pharynx: No oropharyngeal exudate or posterior oropharyngeal erythema.  Eyes:     Conjunctiva/sclera: Conjunctivae normal.  Cardiovascular:     Rate and Rhythm: Normal rate and regular rhythm.  Pulmonary:     Effort: Pulmonary effort is normal. No respiratory distress.     Breath sounds: Normal breath sounds. No wheezing or rales.  Musculoskeletal:     Cervical back: Neck supple. No tenderness.  Lymphadenopathy:     Cervical: No cervical  adenopathy.  Skin:    General: Skin is warm and dry.     Findings: No rash.  Neurological:     Mental Status: He is alert.            Assessment & Plan:    Assessment and Plan Assessment & Plan Acute sinusitis Suspected bacterial sinusitis due to symptom duration and colored discharge. - Prescribed Augmentin  875-125 mg twice daily x 7 days. - Prescribed hycodan for nighttime cough. - Advised continued saline nasal spray use. - Call if symptoms are not improving         [1]  Current Outpatient Medications on File Prior to Visit  Medication Sig Dispense Refill   amLODipine  (NORVASC ) 2.5 MG tablet Take 1 tablet (2.5 mg total) by mouth daily. 90 tablet 1   ibuprofen (ADVIL,MOTRIN) 200 MG tablet Take 200-600 mg by mouth as needed.     Multiple Vitamin (MULTIVITAMIN) tablet Take 1 tablet by mouth as needed.     sertraline  (ZOLOFT ) 25 MG tablet Take 25 mg by mouth daily.     sertraline  (ZOLOFT ) 50 MG tablet Take 0.5 tablets (25 mg total) by mouth daily.     Current Facility-Administered Medications on File Prior to Visit  Medication Dose Route Frequency Provider Last Rate Last Admin   0.9 %  sodium chloride  infusion  500 mL Intravenous Once Stacia Glendia BRAVO, MD       "

## 2024-08-31 NOTE — Patient Instructions (Addendum)
" ° °    ° ° ° °  Medications changes include :   Augmentin , cough syrup for night       Return if symptoms worsen or fail to improve.  "

## 2024-08-31 NOTE — Telephone Encounter (Signed)
 FYI Only or Action Required?: FYI only for provider: appointment scheduled on 08/31/2024.  Patient was last seen in primary care on 06/02/2024 by Job Lukes, PA.  Called Nurse Triage reporting Sinusitis.  Symptoms began several weeks ago.  Interventions attempted: OTC medications: alkaseltzer cold and flu, saline spray.  Symptoms are: gradually worsening.  Triage Disposition: See PCP When Office is Open (Within 3 Days)  Patient/caregiver understands and will follow disposition?: Yes  Copied from CRM #[id number]. Topic: Clinical - Red Word Triage >> Aug 31, 2024 12:48 PM Bernard Mora wrote: Red Word that prompted transfer to Nurse Triage: Patient hasn't been feeling well for the last 3 weeks, has a productive cough with discolored mucous, sneezing, fatigue sinus infection and a lot of drainage. Reason for Disposition  [1] Sinus congestion (pressure, fullness) AND [2] present > 10 days  Answer Assessment - Initial Assessment Questions Not sleeping well. Alkaseltzer cold and flu. Saline nasal spray. Green chest congestion. Losing voice today. Humidifier, sleeping upright. Feels an abundance of fluid Ears are somewhat blocked. Also starts he is coming off zoloft .  2. ONSET: When did the sinus pain start?  (e.g., hours, days)      3 weeks ago. Started to get better and came back and worsening. 4. RECURRENT SYMPTOM: Have you ever had sinus problems before? If Yes, ask: When was the last time? and What happened that time?     6. NASAL DISCHARGE: Do you have discharge from your nose? If so ask, What color?     clear 7. FEVER: Do you have a fever? If Yes, ask: What is it, how was it measured, and when did it start?      Denies 8. OTHER SYMPTOMS: Do you have any other symptoms? (e.g., sore throat, cough, earache, difficulty breathing)     Cough, fatigue, headache  Protocols used: Sinus Pain or Congestion-A-AH

## 2024-09-01 ENCOUNTER — Telehealth: Payer: Self-pay | Admitting: *Deleted

## 2024-09-01 NOTE — Telephone Encounter (Signed)
 Copied from CRM #[id number]. Topic: Clinical - Medical Advice >> Sep 01, 2024 12:56 PM Victoria A wrote: Reason for CRM: Patient would like to know if he can also take Alka Seltzer Cold and Flu with the antibiotic-please call [phone]

## 2024-09-01 NOTE — Telephone Encounter (Signed)
 Please see message and advise.

## 2024-09-02 ENCOUNTER — Ambulatory Visit: Admitting: Sports Medicine

## 2024-09-02 NOTE — Telephone Encounter (Signed)
 Spoke to pt told him Lucie said it is okay to take Alka Seltzer Cold and Flu with the antibiotic. Pt verbalized understanding.

## 2024-09-18 ENCOUNTER — Ambulatory Visit: Payer: Managed Care, Other (non HMO) | Admitting: Physician Assistant

## 2024-09-18 ENCOUNTER — Encounter: Payer: Self-pay | Admitting: Physician Assistant

## 2024-09-18 VITALS — BP 158/102 | HR 72 | Temp 97.9°F | Ht 72.0 in | Wt 214.2 lb

## 2024-09-18 DIAGNOSIS — E538 Deficiency of other specified B group vitamins: Secondary | ICD-10-CM | POA: Diagnosis not present

## 2024-09-18 DIAGNOSIS — E559 Vitamin D deficiency, unspecified: Secondary | ICD-10-CM | POA: Diagnosis not present

## 2024-09-18 DIAGNOSIS — Z789 Other specified health status: Secondary | ICD-10-CM

## 2024-09-18 DIAGNOSIS — Z0000 Encounter for general adult medical examination without abnormal findings: Secondary | ICD-10-CM

## 2024-09-18 DIAGNOSIS — Z1211 Encounter for screening for malignant neoplasm of colon: Secondary | ICD-10-CM

## 2024-09-18 DIAGNOSIS — Z0001 Encounter for general adult medical examination with abnormal findings: Secondary | ICD-10-CM | POA: Diagnosis not present

## 2024-09-18 DIAGNOSIS — F419 Anxiety disorder, unspecified: Secondary | ICD-10-CM

## 2024-09-18 DIAGNOSIS — R202 Paresthesia of skin: Secondary | ICD-10-CM | POA: Diagnosis not present

## 2024-09-18 DIAGNOSIS — I10 Essential (primary) hypertension: Secondary | ICD-10-CM | POA: Diagnosis not present

## 2024-09-18 LAB — CBC WITH DIFFERENTIAL/PLATELET
Basophils Absolute: 0 10*3/uL (ref 0.0–0.1)
Basophils Relative: 0.7 % (ref 0.0–3.0)
Eosinophils Absolute: 0.1 10*3/uL (ref 0.0–0.7)
Eosinophils Relative: 1.3 % (ref 0.0–5.0)
HCT: 42.9 % (ref 39.0–52.0)
Hemoglobin: 14.4 g/dL (ref 13.0–17.0)
Lymphocytes Relative: 40.8 % (ref 12.0–46.0)
Lymphs Abs: 2 10*3/uL (ref 0.7–4.0)
MCHC: 33.7 g/dL (ref 30.0–36.0)
MCV: 83.3 fl (ref 78.0–100.0)
Monocytes Absolute: 0.4 10*3/uL (ref 0.1–1.0)
Monocytes Relative: 9 % (ref 3.0–12.0)
Neutro Abs: 2.3 10*3/uL (ref 1.4–7.7)
Neutrophils Relative %: 48.2 % (ref 43.0–77.0)
Platelets: 246 10*3/uL (ref 150.0–400.0)
RBC: 5.14 Mil/uL (ref 4.22–5.81)
RDW: 13.4 % (ref 11.5–15.5)
WBC: 4.9 10*3/uL (ref 4.0–10.5)

## 2024-09-18 LAB — COMPREHENSIVE METABOLIC PANEL WITH GFR
ALT: 19 U/L (ref 3–53)
AST: 18 U/L (ref 5–37)
Albumin: 4.5 g/dL (ref 3.5–5.2)
Alkaline Phosphatase: 46 U/L (ref 39–117)
BUN: 12 mg/dL (ref 6–23)
CO2: 29 meq/L (ref 19–32)
Calcium: 9.1 mg/dL (ref 8.4–10.5)
Chloride: 101 meq/L (ref 96–112)
Creatinine, Ser: 1 mg/dL (ref 0.40–1.50)
GFR: 89.02 mL/min
Glucose, Bld: 106 mg/dL — ABNORMAL HIGH (ref 70–99)
Potassium: 3.9 meq/L (ref 3.5–5.1)
Sodium: 139 meq/L (ref 135–145)
Total Bilirubin: 0.5 mg/dL (ref 0.2–1.2)
Total Protein: 7 g/dL (ref 6.0–8.3)

## 2024-09-18 LAB — LIPID PANEL
Cholesterol: 291 mg/dL — ABNORMAL HIGH (ref 28–200)
HDL: 69.3 mg/dL
LDL Cholesterol: 190 mg/dL — ABNORMAL HIGH (ref 10–99)
NonHDL: 221.25
Total CHOL/HDL Ratio: 4
Triglycerides: 158 mg/dL — ABNORMAL HIGH (ref 10.0–149.0)
VLDL: 31.6 mg/dL (ref 0.0–40.0)

## 2024-09-18 LAB — TSH: TSH: 2.05 u[IU]/mL (ref 0.35–5.50)

## 2024-09-18 LAB — TESTOSTERONE: Testosterone: 369.69 ng/dL (ref 300.00–890.00)

## 2024-09-18 LAB — VITAMIN D 25 HYDROXY (VIT D DEFICIENCY, FRACTURES): VITD: 38.52 ng/mL (ref 30.00–100.00)

## 2024-09-18 LAB — VITAMIN B12: Vitamin B-12: 528 pg/mL (ref 211–911)

## 2024-09-18 MED ORDER — AMLODIPINE BESYLATE 5 MG PO TABS: 5.0000 mg | ORAL_TABLET | Freq: Every day | ORAL | 1 refills | Status: AC

## 2024-09-18 NOTE — Progress Notes (Signed)
 "  Subjective:    Bernard Mora is a 49 y.o. male and is here for a comprehensive physical exam.  HPI  There are no preventive care reminders to display for this patient.  Discussed the use of AI scribe software for clinical note transcription with the patient, who gave verbal consent to proceed.  History of Present Illness   Bernard Mora is a 49 year old male who presents for Comprehensive Physical Exam (CPE) preventive care annual visit with ongoing symptoms after tapering off Zoloft  and recent sinus infection.  Approximately five weeks ago, he had a severe sinus infection with marked head congestion, drainage, and discolored mucus, without fever or chest symptoms. Congestion temporarily affected his vision. Symptoms resolved after oxacillin.  He has been tapering off Zoloft  from 25 mg to 12.5 mg every other day because of fatigue and dizziness. Fatigue and dizziness have improved, but he has persistent mild tingling that can worsen with physical activity. He drinks 1-2 cups of coffee in the morning and sometimes in the afternoon and is considering whether caffeine sensitivity contributes to his symptoms.  He has hypertension with home readings ranging from about 120/70 to 140/90, often higher when he is active. He is currently taking amlodipine  2.5 mg daily. He was on higher dosage of 5 mg daily but we tapered down as we felt he was over-controlled in the past.  He has a family history of anxiety and depression with relatives on high-dose Zoloft . He is under significant stress at home, including caring for a father-in-law with lung cancer and COPD and managing recent difficult family visits.  He maintains a healthy diet, limits alcohol , is considering cutting back on caffeine, and takes B12 and D3 with K supplements.      Health Maintenance: Immunizations -- UpToDate  Colonoscopy -- due later this year PSA --  Lab Results  Component Value Date   PSA 0.39 05/26/2018   PSA 0.52 05/27/2017    Diet -- overall healthy Sleep habits -- no major concerns Exercise -- regular exercise  Weight -- Weight: 214 lb 4 oz (97.2 kg)  Recent weight history Wt Readings from Last 10 Encounters:  09/18/24 214 lb 4 oz (97.2 kg)  08/31/24 211 lb (95.7 kg)  07/22/24 209 lb (94.8 kg)  06/02/24 207 lb 12.8 oz (94.3 kg)  05/07/24 205 lb (93 kg)  05/07/24 207 lb 4 oz (94 kg)  12/11/23 209 lb 6 oz (95 kg)  09/17/23 206 lb 8 oz (93.7 kg)  09/10/23 207 lb (93.9 kg)  11/15/22 200 lb 8 oz (90.9 kg)   Body mass index is 29.06 kg/m.  Mood -- stable Alcohol  use --  reports current alcohol  use of about 20.0 standard drinks of alcohol  per week.  Tobacco use --  Tobacco Use: Low Risk (09/18/2024)   Patient History    Smoking Tobacco Use: Never    Smokeless Tobacco Use: Never    Passive Exposure: Never    Eligible for Low Dose CT? no  UTD with eye doctor? yes UTD with dentist? yes     09/18/2024    8:06 AM  Depression screen PHQ 2/9  Decreased Interest 0  Down, Depressed, Hopeless 0  PHQ - 2 Score 0  Altered sleeping 0  Tired, decreased energy 0  Change in appetite 0  Feeling bad or failure about yourself  0  Trouble concentrating 0  Moving slowly or fidgety/restless 0  Suicidal thoughts 0  PHQ-9 Score 0  Difficult doing work/chores Not  difficult at all    Other providers/specialists: Patient Care Team: Job Lukes, [place] as PCP - General (Physician Assistant)    PMHx, SurgHx, SocialHx, Medications, and Allergies were reviewed in the Visit Navigator and updated as appropriate.   Past Medical History:  Diagnosis Date   Allergy    Anxiety    Arthritis    GERD (gastroesophageal reflux disease)    Hyperlipidemia    Hypertension      Past Surgical History:  Procedure Laterality Date   arm fracture Left    crack ribs Right 07/30/2016   FRACTURE SURGERY Left 1993   wrist   TONSILLECTOMY       Family History  Problem Relation Age of Onset   Arthritis Mother     Diverticulitis Mother        did require colostomy   Cancer Father        don't know the type, possible kidney/lung and mets   Early death Father    Other Brother        skin lesions   Diabetes Paternal Grandfather    Alcohol  abuse Maternal Uncle    Colon cancer Neg Hx    Esophageal cancer Neg Hx    Rectal cancer Neg Hx    Stomach cancer Neg Hx     Social History[1]  Review of Systems:   Review of Systems  Constitutional:  Negative for chills, fever, malaise/fatigue and weight loss.  HENT:  Negative for hearing loss, sinus pain and sore throat.   Respiratory:  Negative for cough and hemoptysis.   Cardiovascular:  Negative for chest pain, palpitations, leg swelling and PND.  Gastrointestinal:  Negative for abdominal pain, constipation, diarrhea, heartburn, nausea and vomiting.  Genitourinary:  Negative for dysuria, frequency and urgency.  Musculoskeletal:  Negative for back pain, myalgias and neck pain.  Skin:  Negative for itching and rash.  Neurological:  Negative for dizziness, tingling, seizures and headaches.  Endo/Heme/Allergies:  Negative for polydipsia.  Psychiatric/Behavioral:  Negative for depression. The patient is not nervous/anxious.     Objective:    Vitals:   09/18/24 0807 09/18/24 0840  BP: (!) 142/90 (!) 158/102  Pulse: 72   Temp: 97.9 F (36.6 C)   SpO2: 99%     Body mass index is 29.06 kg/m.  General  Alert, cooperative, no distress, appears stated age  Head:  Normocephalic, without obvious abnormality, atraumatic  Eyes:  PERRL, conjunctiva/corneas clear, EOM's intact, fundi benign, both eyes       Ears:  Normal TM's and external ear canals, both ears  Nose: Nares normal, septum midline, mucosa normal, no drainage or sinus tenderness  Throat: Lips, mucosa, and tongue normal; teeth and gums normal  Neck: Supple, symmetrical, trachea midline, no adenopathy;     thyroid :  No enlargement/tenderness/nodules; no carotid bruit or JVD  Back:    Symmetric, no curvature, ROM normal, no CVA tenderness  Lungs:   Clear to auscultation bilaterally, respirations unlabored  Chest wall:  No tenderness or deformity  Heart:  Regular rate and rhythm, S1 and S2 normal, no murmur, rub or gallop  Abdomen:   Soft, non-tender, bowel sounds active all four quadrants, no masses, no organomegaly  Extremities: Extremities normal, atraumatic, no cyanosis or edema  Prostate : Deferred   Skin: Skin color, texture, turgor normal, no rashes or lesions  Lymph nodes: Cervical, supraclavicular, and axillary nodes normal  Neurologic: CNII-XII grossly intact. Normal strength, sensation and reflexes throughout   AssessmentPlan:   Assessment and  Plan    General Health Maintenance Due for colonoscopy due to suboptimal prep in 2023. Dental and eye check-ups current. Dermatology visit completed with no issues. - Schedule colonoscopy with augmented bowel prep. - Continue regular dental and eye check-ups.   Primary hypertension Blood pressure fluctuates between 120/70 and 142/90, with spikes to 150s during activity. Current amlodipine  dose is low. No chest pain or dyspnea. Home monitoring shows variability. Dietary modifications in progress. - Increased amlodipine  to 5 mg daily. - Continue home blood pressure monitoring. - Encouraged dietary modifications.  Anxiety disorder Tapering off Zoloft  due to fatigue and dizziness. Anxiety stable despite stressors. Mood stable, focus improved. Considering caffeine reduction. - Continue tapering Zoloft . - Monitor anxiety levels and mood. - Consider reducing caffeine intake after 1-2 weeks of Zoloft  taper.  Tingling sensation Intermittent tingling, possibly caffeine-related. Symptoms decreased with Zoloft  taper. - Monitor symptoms as Zoloft  taper progresses. - Consider reducing caffeine intake after 1-2 weeks of Zoloft  taper.  Vitamin D deficiency Taking vitamin D3 with K. No new symptoms. - Continue vitamin D3 with  K supplementation.  Vitamin B12 deficiency Taking vitamin B12 supplements. No new symptoms. - Continue vitamin B12 supplementation.           Lucie Buttner, PA-C [facility]         [1]  Social History Tobacco Use   Smoking status: Never    Passive exposure: Never   Smokeless tobacco: Never  Vaping Use   Vaping status: Never Used  Substance Use Topics   Alcohol  use: Yes    Alcohol /week: 20.0 standard drinks of alcohol     Types: 20 Cans of beer per week    Comment: I drink usually from 3-4 each night socially.   Drug use: Never   "

## 2024-09-21 ENCOUNTER — Ambulatory Visit: Payer: Self-pay | Admitting: Physician Assistant

## 2024-09-21 DIAGNOSIS — R7309 Other abnormal glucose: Secondary | ICD-10-CM

## 2024-09-21 DIAGNOSIS — E785 Hyperlipidemia, unspecified: Secondary | ICD-10-CM

## 2024-11-04 ENCOUNTER — Other Ambulatory Visit

## 2025-03-22 ENCOUNTER — Ambulatory Visit: Admitting: Dermatology

## 2025-09-20 ENCOUNTER — Encounter: Admitting: Physician Assistant
